# Patient Record
Sex: Male | Born: 1965 | Race: Black or African American | Hispanic: No | Marital: Single | Smoking: Former smoker
Health system: Southern US, Community
[De-identification: ages and names within clinical notes are randomized; demographics above are authoritative.]

## PROBLEM LIST (undated history)

## (undated) DIAGNOSIS — M87051 Idiopathic aseptic necrosis of right femur: Secondary | ICD-10-CM

## (undated) DIAGNOSIS — B20 Human immunodeficiency virus [HIV] disease: Secondary | ICD-10-CM

## (undated) DIAGNOSIS — Z21 Asymptomatic human immunodeficiency virus [HIV] infection status: Secondary | ICD-10-CM

## (undated) DIAGNOSIS — M87052 Idiopathic aseptic necrosis of left femur: Secondary | ICD-10-CM

## (undated) HISTORY — PX: FACIAL LACERATION REPAIR: SHX6589

## (undated) HISTORY — PX: JOINT REPLACEMENT: SHX530

---

## 1995-09-17 ENCOUNTER — Encounter (INDEPENDENT_AMBULATORY_CARE_PROVIDER_SITE_OTHER): Payer: Self-pay | Admitting: *Deleted

## 1995-09-17 LAB — CONVERTED CEMR LAB
CD4 Count: 390 microliters
CD4 T Cell Abs: 390

## 1999-03-19 DIAGNOSIS — B1710 Acute hepatitis C without hepatic coma: Secondary | ICD-10-CM | POA: Insufficient documentation

## 1999-03-19 DIAGNOSIS — B20 Human immunodeficiency virus [HIV] disease: Secondary | ICD-10-CM | POA: Insufficient documentation

## 1999-04-06 ENCOUNTER — Inpatient Hospital Stay (HOSPITAL_COMMUNITY): Admission: EM | Admit: 1999-04-06 | Discharge: 1999-04-12 | Payer: Self-pay | Admitting: Internal Medicine

## 1999-04-06 ENCOUNTER — Encounter: Payer: Self-pay | Admitting: Internal Medicine

## 1999-04-08 ENCOUNTER — Encounter: Payer: Self-pay | Admitting: Infectious Diseases

## 1999-04-09 ENCOUNTER — Encounter: Payer: Self-pay | Admitting: Infectious Diseases

## 1999-04-19 ENCOUNTER — Encounter: Admission: RE | Admit: 1999-04-19 | Discharge: 1999-04-19 | Payer: Self-pay | Admitting: Infectious Diseases

## 1999-04-19 ENCOUNTER — Ambulatory Visit (HOSPITAL_COMMUNITY): Admission: RE | Admit: 1999-04-19 | Discharge: 1999-04-19 | Payer: Self-pay | Admitting: Infectious Diseases

## 1999-04-27 ENCOUNTER — Encounter: Admission: RE | Admit: 1999-04-27 | Discharge: 1999-04-27 | Payer: Self-pay | Admitting: Infectious Diseases

## 1999-06-15 ENCOUNTER — Encounter: Admission: RE | Admit: 1999-06-15 | Discharge: 1999-06-15 | Payer: Self-pay | Admitting: Infectious Diseases

## 1999-06-23 ENCOUNTER — Ambulatory Visit (HOSPITAL_COMMUNITY): Admission: RE | Admit: 1999-06-23 | Discharge: 1999-06-23 | Payer: Self-pay | Admitting: Infectious Diseases

## 1999-06-27 ENCOUNTER — Encounter: Admission: RE | Admit: 1999-06-27 | Discharge: 1999-06-27 | Payer: Self-pay | Admitting: Infectious Diseases

## 1999-08-01 ENCOUNTER — Ambulatory Visit (HOSPITAL_COMMUNITY): Admission: RE | Admit: 1999-08-01 | Discharge: 1999-08-01 | Payer: Self-pay | Admitting: Infectious Diseases

## 1999-08-01 ENCOUNTER — Encounter: Admission: RE | Admit: 1999-08-01 | Discharge: 1999-08-01 | Payer: Self-pay | Admitting: Infectious Diseases

## 1999-09-07 ENCOUNTER — Encounter: Admission: RE | Admit: 1999-09-07 | Discharge: 1999-09-07 | Payer: Self-pay | Admitting: Infectious Diseases

## 2000-06-27 ENCOUNTER — Encounter: Admission: RE | Admit: 2000-06-27 | Discharge: 2000-06-27 | Payer: Self-pay | Admitting: Hematology and Oncology

## 2000-06-27 ENCOUNTER — Ambulatory Visit (HOSPITAL_COMMUNITY): Admission: RE | Admit: 2000-06-27 | Discharge: 2000-06-27 | Payer: Self-pay | Admitting: Hematology and Oncology

## 2000-07-20 ENCOUNTER — Encounter: Payer: Self-pay | Admitting: Infectious Diseases

## 2000-07-20 ENCOUNTER — Ambulatory Visit (HOSPITAL_COMMUNITY): Admission: RE | Admit: 2000-07-20 | Discharge: 2000-07-20 | Payer: Self-pay | Admitting: Infectious Diseases

## 2000-07-20 ENCOUNTER — Encounter: Admission: RE | Admit: 2000-07-20 | Discharge: 2000-07-20 | Payer: Self-pay | Admitting: Infectious Diseases

## 2000-07-20 ENCOUNTER — Inpatient Hospital Stay (HOSPITAL_COMMUNITY): Admission: EM | Admit: 2000-07-20 | Discharge: 2000-07-23 | Payer: Self-pay | Admitting: Internal Medicine

## 2000-07-23 ENCOUNTER — Encounter: Payer: Self-pay | Admitting: Internal Medicine

## 2000-07-27 ENCOUNTER — Encounter: Admission: RE | Admit: 2000-07-27 | Discharge: 2000-07-27 | Payer: Self-pay | Admitting: Internal Medicine

## 2000-08-08 ENCOUNTER — Encounter: Admission: RE | Admit: 2000-08-08 | Discharge: 2000-08-08 | Payer: Self-pay | Admitting: Infectious Diseases

## 2000-10-08 ENCOUNTER — Ambulatory Visit (HOSPITAL_COMMUNITY): Admission: RE | Admit: 2000-10-08 | Discharge: 2000-10-08 | Payer: Self-pay | Admitting: Infectious Diseases

## 2000-10-08 ENCOUNTER — Encounter: Admission: RE | Admit: 2000-10-08 | Discharge: 2000-10-08 | Payer: Self-pay | Admitting: Infectious Diseases

## 2000-12-03 ENCOUNTER — Encounter: Admission: RE | Admit: 2000-12-03 | Discharge: 2000-12-03 | Payer: Self-pay | Admitting: Infectious Diseases

## 2000-12-18 HISTORY — PX: TOTAL HIP ARTHROPLASTY: SHX124

## 2000-12-31 ENCOUNTER — Encounter: Admission: RE | Admit: 2000-12-31 | Discharge: 2000-12-31 | Payer: Self-pay | Admitting: Infectious Diseases

## 2001-01-01 ENCOUNTER — Encounter: Admission: RE | Admit: 2001-01-01 | Discharge: 2001-01-01 | Payer: Self-pay | Admitting: Infectious Diseases

## 2001-01-29 ENCOUNTER — Ambulatory Visit (HOSPITAL_COMMUNITY): Admission: RE | Admit: 2001-01-29 | Discharge: 2001-01-29 | Payer: Self-pay | Admitting: Infectious Diseases

## 2001-01-29 ENCOUNTER — Encounter: Admission: RE | Admit: 2001-01-29 | Discharge: 2001-01-29 | Payer: Self-pay | Admitting: Infectious Diseases

## 2001-02-25 ENCOUNTER — Encounter: Admission: RE | Admit: 2001-02-25 | Discharge: 2001-02-25 | Payer: Self-pay | Admitting: Infectious Diseases

## 2001-05-15 ENCOUNTER — Encounter: Admission: RE | Admit: 2001-05-15 | Discharge: 2001-05-15 | Payer: Self-pay | Admitting: Infectious Diseases

## 2001-05-15 ENCOUNTER — Ambulatory Visit (HOSPITAL_COMMUNITY): Admission: RE | Admit: 2001-05-15 | Discharge: 2001-05-15 | Payer: Self-pay | Admitting: Infectious Diseases

## 2001-06-03 ENCOUNTER — Encounter: Admission: RE | Admit: 2001-06-03 | Discharge: 2001-06-03 | Payer: Self-pay | Admitting: Infectious Diseases

## 2001-09-02 ENCOUNTER — Encounter: Admission: RE | Admit: 2001-09-02 | Discharge: 2001-09-02 | Payer: Self-pay | Admitting: Infectious Diseases

## 2001-09-02 ENCOUNTER — Ambulatory Visit (HOSPITAL_COMMUNITY): Admission: RE | Admit: 2001-09-02 | Discharge: 2001-09-02 | Payer: Self-pay | Admitting: Infectious Diseases

## 2001-09-23 ENCOUNTER — Encounter: Admission: RE | Admit: 2001-09-23 | Discharge: 2001-09-23 | Payer: Self-pay | Admitting: Infectious Diseases

## 2002-01-15 ENCOUNTER — Ambulatory Visit (HOSPITAL_COMMUNITY): Admission: RE | Admit: 2002-01-15 | Discharge: 2002-01-15 | Payer: Self-pay | Admitting: Infectious Diseases

## 2002-01-15 ENCOUNTER — Encounter: Admission: RE | Admit: 2002-01-15 | Discharge: 2002-01-15 | Payer: Self-pay | Admitting: Infectious Diseases

## 2002-02-18 ENCOUNTER — Encounter: Admission: RE | Admit: 2002-02-18 | Discharge: 2002-02-18 | Payer: Self-pay | Admitting: Infectious Diseases

## 2002-03-10 ENCOUNTER — Encounter: Admission: RE | Admit: 2002-03-10 | Discharge: 2002-03-10 | Payer: Self-pay | Admitting: Infectious Diseases

## 2002-06-09 ENCOUNTER — Ambulatory Visit (HOSPITAL_COMMUNITY): Admission: RE | Admit: 2002-06-09 | Discharge: 2002-06-09 | Payer: Self-pay | Admitting: Infectious Diseases

## 2002-06-09 ENCOUNTER — Encounter: Admission: RE | Admit: 2002-06-09 | Discharge: 2002-06-09 | Payer: Self-pay | Admitting: Internal Medicine

## 2002-08-04 ENCOUNTER — Encounter: Admission: RE | Admit: 2002-08-04 | Discharge: 2002-08-04 | Payer: Self-pay | Admitting: Infectious Diseases

## 2002-11-03 ENCOUNTER — Ambulatory Visit (HOSPITAL_COMMUNITY): Admission: RE | Admit: 2002-11-03 | Discharge: 2002-11-03 | Payer: Self-pay | Admitting: Infectious Diseases

## 2002-11-03 ENCOUNTER — Encounter: Admission: RE | Admit: 2002-11-03 | Discharge: 2002-11-03 | Payer: Self-pay | Admitting: Internal Medicine

## 2003-02-09 ENCOUNTER — Encounter: Admission: RE | Admit: 2003-02-09 | Discharge: 2003-02-09 | Payer: Self-pay | Admitting: Infectious Diseases

## 2003-02-12 ENCOUNTER — Encounter: Admission: RE | Admit: 2003-02-12 | Discharge: 2003-02-12 | Payer: Self-pay | Admitting: Infectious Diseases

## 2003-02-23 ENCOUNTER — Encounter: Admission: RE | Admit: 2003-02-23 | Discharge: 2003-02-23 | Payer: Self-pay | Admitting: Infectious Diseases

## 2003-04-18 DIAGNOSIS — M8700 Idiopathic aseptic necrosis of unspecified bone: Secondary | ICD-10-CM | POA: Insufficient documentation

## 2003-05-04 ENCOUNTER — Encounter: Payer: Self-pay | Admitting: Infectious Diseases

## 2003-05-04 ENCOUNTER — Ambulatory Visit (HOSPITAL_COMMUNITY): Admission: RE | Admit: 2003-05-04 | Discharge: 2003-05-04 | Payer: Self-pay | Admitting: Infectious Diseases

## 2003-05-18 ENCOUNTER — Encounter (INDEPENDENT_AMBULATORY_CARE_PROVIDER_SITE_OTHER): Payer: Self-pay | Admitting: Infectious Diseases

## 2003-05-18 ENCOUNTER — Encounter: Admission: RE | Admit: 2003-05-18 | Discharge: 2003-05-18 | Payer: Self-pay | Admitting: Infectious Diseases

## 2003-06-08 ENCOUNTER — Ambulatory Visit (HOSPITAL_COMMUNITY): Admission: RE | Admit: 2003-06-08 | Discharge: 2003-06-08 | Payer: Self-pay | Admitting: Infectious Diseases

## 2003-06-08 ENCOUNTER — Encounter: Admission: RE | Admit: 2003-06-08 | Discharge: 2003-06-08 | Payer: Self-pay | Admitting: Infectious Diseases

## 2003-06-08 ENCOUNTER — Encounter: Payer: Self-pay | Admitting: Infectious Diseases

## 2003-09-18 DIAGNOSIS — N289 Disorder of kidney and ureter, unspecified: Secondary | ICD-10-CM | POA: Insufficient documentation

## 2003-09-28 ENCOUNTER — Encounter: Admission: RE | Admit: 2003-09-28 | Discharge: 2003-09-28 | Payer: Self-pay | Admitting: Infectious Diseases

## 2003-09-28 ENCOUNTER — Ambulatory Visit (HOSPITAL_COMMUNITY): Admission: RE | Admit: 2003-09-28 | Discharge: 2003-09-28 | Payer: Self-pay | Admitting: Infectious Diseases

## 2003-09-28 ENCOUNTER — Encounter (INDEPENDENT_AMBULATORY_CARE_PROVIDER_SITE_OTHER): Payer: Self-pay | Admitting: Infectious Diseases

## 2003-10-12 ENCOUNTER — Encounter: Admission: RE | Admit: 2003-10-12 | Discharge: 2003-10-12 | Payer: Self-pay | Admitting: Infectious Diseases

## 2003-10-14 ENCOUNTER — Encounter: Admission: RE | Admit: 2003-10-14 | Discharge: 2003-10-14 | Payer: Self-pay | Admitting: Infectious Diseases

## 2003-10-16 ENCOUNTER — Ambulatory Visit (HOSPITAL_COMMUNITY): Admission: RE | Admit: 2003-10-16 | Discharge: 2003-10-16 | Payer: Self-pay | Admitting: Infectious Diseases

## 2003-12-09 ENCOUNTER — Encounter: Admission: RE | Admit: 2003-12-09 | Discharge: 2003-12-09 | Payer: Self-pay | Admitting: Infectious Diseases

## 2003-12-28 ENCOUNTER — Encounter: Admission: RE | Admit: 2003-12-28 | Discharge: 2003-12-28 | Payer: Self-pay | Admitting: Infectious Diseases

## 2004-02-08 ENCOUNTER — Encounter: Admission: RE | Admit: 2004-02-08 | Discharge: 2004-02-08 | Payer: Self-pay | Admitting: Infectious Diseases

## 2004-05-02 ENCOUNTER — Encounter: Admission: RE | Admit: 2004-05-02 | Discharge: 2004-05-02 | Payer: Self-pay | Admitting: Infectious Diseases

## 2004-05-23 ENCOUNTER — Encounter: Admission: RE | Admit: 2004-05-23 | Discharge: 2004-05-23 | Payer: Self-pay | Admitting: Infectious Diseases

## 2004-06-07 ENCOUNTER — Inpatient Hospital Stay (HOSPITAL_COMMUNITY): Admission: RE | Admit: 2004-06-07 | Discharge: 2004-06-11 | Payer: Self-pay | Admitting: Orthopedic Surgery

## 2004-08-24 ENCOUNTER — Ambulatory Visit (HOSPITAL_COMMUNITY): Admission: RE | Admit: 2004-08-24 | Discharge: 2004-08-24 | Payer: Self-pay | Admitting: Infectious Diseases

## 2004-08-24 ENCOUNTER — Ambulatory Visit: Payer: Self-pay | Admitting: Infectious Diseases

## 2004-09-21 ENCOUNTER — Ambulatory Visit: Payer: Self-pay | Admitting: Infectious Diseases

## 2005-01-02 ENCOUNTER — Ambulatory Visit (HOSPITAL_COMMUNITY): Admission: RE | Admit: 2005-01-02 | Discharge: 2005-01-02 | Payer: Self-pay | Admitting: Infectious Diseases

## 2005-01-02 ENCOUNTER — Ambulatory Visit: Payer: Self-pay | Admitting: Infectious Diseases

## 2005-01-16 ENCOUNTER — Ambulatory Visit: Payer: Self-pay | Admitting: Infectious Diseases

## 2005-01-26 ENCOUNTER — Emergency Department: Payer: Self-pay | Admitting: Internal Medicine

## 2005-01-26 ENCOUNTER — Encounter (INDEPENDENT_AMBULATORY_CARE_PROVIDER_SITE_OTHER): Payer: Self-pay | Admitting: Specialist

## 2005-01-26 ENCOUNTER — Inpatient Hospital Stay (HOSPITAL_COMMUNITY): Admission: EM | Admit: 2005-01-26 | Discharge: 2005-01-28 | Payer: Self-pay | Admitting: Internal Medicine

## 2005-01-26 ENCOUNTER — Ambulatory Visit: Payer: Self-pay | Admitting: Internal Medicine

## 2005-01-30 ENCOUNTER — Ambulatory Visit: Payer: Self-pay | Admitting: Infectious Diseases

## 2005-02-08 ENCOUNTER — Ambulatory Visit: Payer: Self-pay | Admitting: Infectious Diseases

## 2005-03-15 ENCOUNTER — Ambulatory Visit: Payer: Self-pay | Admitting: Infectious Diseases

## 2005-04-26 ENCOUNTER — Ambulatory Visit (HOSPITAL_COMMUNITY): Admission: RE | Admit: 2005-04-26 | Discharge: 2005-04-26 | Payer: Self-pay | Admitting: Infectious Diseases

## 2005-04-26 ENCOUNTER — Ambulatory Visit: Payer: Self-pay | Admitting: Infectious Diseases

## 2005-05-10 ENCOUNTER — Ambulatory Visit: Payer: Self-pay | Admitting: Infectious Diseases

## 2005-06-14 ENCOUNTER — Ambulatory Visit: Payer: Self-pay | Admitting: Infectious Diseases

## 2005-06-16 ENCOUNTER — Ambulatory Visit: Payer: Self-pay | Admitting: Infectious Diseases

## 2005-09-11 ENCOUNTER — Ambulatory Visit (HOSPITAL_COMMUNITY): Admission: RE | Admit: 2005-09-11 | Discharge: 2005-09-11 | Payer: Self-pay | Admitting: Infectious Diseases

## 2005-09-11 ENCOUNTER — Ambulatory Visit: Payer: Self-pay | Admitting: Infectious Diseases

## 2005-09-11 ENCOUNTER — Encounter (INDEPENDENT_AMBULATORY_CARE_PROVIDER_SITE_OTHER): Payer: Self-pay | Admitting: *Deleted

## 2005-09-11 LAB — CONVERTED CEMR LAB
CD4 Count: 430 microliters
HIV 1 RNA Quant: 49 copies/mL

## 2005-09-26 ENCOUNTER — Ambulatory Visit: Payer: Self-pay | Admitting: Infectious Diseases

## 2006-01-25 ENCOUNTER — Ambulatory Visit: Payer: Self-pay | Admitting: Internal Medicine

## 2006-01-25 ENCOUNTER — Encounter (INDEPENDENT_AMBULATORY_CARE_PROVIDER_SITE_OTHER): Payer: Self-pay | Admitting: *Deleted

## 2006-01-25 LAB — CONVERTED CEMR LAB: HIV 1 RNA Quant: 49 copies/mL

## 2006-02-14 ENCOUNTER — Ambulatory Visit: Payer: Self-pay | Admitting: Infectious Diseases

## 2006-08-16 ENCOUNTER — Encounter (INDEPENDENT_AMBULATORY_CARE_PROVIDER_SITE_OTHER): Payer: Self-pay | Admitting: *Deleted

## 2006-08-16 ENCOUNTER — Encounter: Admission: RE | Admit: 2006-08-16 | Discharge: 2006-08-16 | Payer: Self-pay | Admitting: Infectious Diseases

## 2006-08-16 ENCOUNTER — Ambulatory Visit: Payer: Self-pay | Admitting: Infectious Diseases

## 2006-08-16 LAB — CONVERTED CEMR LAB
CD4 Count: 530 microliters
HIV 1 RNA Quant: 160 copies/mL

## 2006-09-03 ENCOUNTER — Ambulatory Visit: Payer: Self-pay | Admitting: Infectious Diseases

## 2006-10-23 DIAGNOSIS — B370 Candidal stomatitis: Secondary | ICD-10-CM | POA: Insufficient documentation

## 2006-10-23 DIAGNOSIS — B59 Pneumocystosis: Secondary | ICD-10-CM | POA: Insufficient documentation

## 2006-10-23 DIAGNOSIS — T8489XA Other specified complication of internal orthopedic prosthetic devices, implants and grafts, initial encounter: Secondary | ICD-10-CM | POA: Insufficient documentation

## 2007-02-05 ENCOUNTER — Encounter (INDEPENDENT_AMBULATORY_CARE_PROVIDER_SITE_OTHER): Payer: Self-pay | Admitting: *Deleted

## 2007-02-05 ENCOUNTER — Ambulatory Visit: Payer: Self-pay | Admitting: Infectious Diseases

## 2007-02-05 ENCOUNTER — Encounter: Admission: RE | Admit: 2007-02-05 | Discharge: 2007-02-05 | Payer: Self-pay | Admitting: Infectious Diseases

## 2007-02-05 LAB — CONVERTED CEMR LAB
ALT: 26 units/L (ref 0–53)
AST: 24 units/L (ref 0–37)
Albumin: 4.6 g/dL (ref 3.5–5.2)
Alkaline Phosphatase: 78 units/L (ref 39–117)
BUN: 19 mg/dL (ref 6–23)
Basophils Absolute: 0 10*3/uL (ref 0.0–0.1)
Basophils Relative: 1 % (ref 0–1)
Bilirubin Urine: NEGATIVE
CD4 Count: 500 microliters
CO2: 26 meq/L (ref 19–32)
Calcium: 9.6 mg/dL (ref 8.4–10.5)
Chloride: 106 meq/L (ref 96–112)
Cholesterol: 238 mg/dL — ABNORMAL HIGH (ref 0–200)
Creatinine, Ser: 1.59 mg/dL — ABNORMAL HIGH (ref 0.40–1.50)
Eosinophils Absolute: 0.1 10*3/uL (ref 0.0–0.7)
Eosinophils Relative: 1 % (ref 0–5)
Glucose, Bld: 86 mg/dL (ref 70–99)
HCT: 46.4 % (ref 39.0–52.0)
HDL: 69 mg/dL (ref >39)
HIV 1 RNA Quant: 159 copies/mL — ABNORMAL HIGH (ref <50)
HIV-1 RNA Quant, Log: 2.2 — ABNORMAL HIGH (ref <1.70)
Hemoglobin, Urine: NEGATIVE
Hemoglobin: 15.2 g/dL (ref 13.0–17.0)
Ketones, ur: NEGATIVE mg/dL
LDL Cholesterol: 136 mg/dL — ABNORMAL HIGH (ref 0–99)
Leukocytes, UA: NEGATIVE
Lymphocytes Relative: 40 % (ref 12–46)
Lymphs Abs: 1.9 10*3/uL (ref 0.7–3.3)
MCHC: 32.8 g/dL (ref 30.0–36.0)
MCV: 97.5 fL (ref 78.0–100.0)
Monocytes Absolute: 0.5 10*3/uL (ref 0.2–0.7)
Monocytes Relative: 11 % (ref 3–11)
Neutro Abs: 2.3 10*3/uL (ref 1.7–7.7)
Neutrophils Relative %: 48 % (ref 43–77)
Nitrite: NEGATIVE
Platelets: 149 10*3/uL — ABNORMAL LOW (ref 150–400)
Potassium: 4.3 meq/L (ref 3.5–5.3)
Protein, ur: NEGATIVE mg/dL
RBC: 4.76 M/uL (ref 4.22–5.81)
RDW: 13 % (ref 11.5–14.0)
Sodium: 141 meq/L (ref 135–145)
Specific Gravity, Urine: 1.021 (ref 1.005–1.03)
Total Bilirubin: 1.1 mg/dL (ref 0.3–1.2)
Total CHOL/HDL Ratio: 3.4
Total Protein: 7.6 g/dL (ref 6.0–8.3)
Triglycerides: 164 mg/dL — ABNORMAL HIGH (ref <150)
Urine Glucose: NEGATIVE mg/dL
Urobilinogen, UA: 1 (ref 0.0–1.0)
VLDL: 33 mg/dL (ref 0–40)
WBC: 4.8 10*3/uL (ref 4.0–10.5)
pH: 7 (ref 5.0–8.0)

## 2007-02-11 ENCOUNTER — Encounter (INDEPENDENT_AMBULATORY_CARE_PROVIDER_SITE_OTHER): Payer: Self-pay | Admitting: *Deleted

## 2007-02-11 LAB — CONVERTED CEMR LAB: HCV Quantitative: 4680000 intl units/mL

## 2007-02-21 ENCOUNTER — Ambulatory Visit: Payer: Self-pay | Admitting: Infectious Diseases

## 2007-02-24 ENCOUNTER — Encounter (INDEPENDENT_AMBULATORY_CARE_PROVIDER_SITE_OTHER): Payer: Self-pay | Admitting: *Deleted

## 2007-02-26 ENCOUNTER — Encounter (INDEPENDENT_AMBULATORY_CARE_PROVIDER_SITE_OTHER): Payer: Self-pay | Admitting: *Deleted

## 2007-11-11 ENCOUNTER — Encounter: Admission: RE | Admit: 2007-11-11 | Discharge: 2007-11-11 | Payer: Self-pay | Admitting: *Deleted

## 2007-11-11 ENCOUNTER — Encounter: Payer: Self-pay | Admitting: Infectious Diseases

## 2007-11-11 ENCOUNTER — Ambulatory Visit: Payer: Self-pay | Admitting: Infectious Diseases

## 2007-11-11 LAB — CONVERTED CEMR LAB
ALT: 19 units/L (ref 0–53)
AST: 24 units/L (ref 0–37)
Albumin: 4.4 g/dL (ref 3.5–5.2)
Alkaline Phosphatase: 68 units/L (ref 39–117)
BUN: 18 mg/dL (ref 6–23)
Basophils Absolute: 0 10*3/uL (ref 0.0–0.1)
Basophils Relative: 1 % (ref 0–1)
CO2: 21 meq/L (ref 19–32)
Calcium: 9.5 mg/dL (ref 8.4–10.5)
Chloride: 106 meq/L (ref 96–112)
Creatinine, Ser: 1.59 mg/dL — ABNORMAL HIGH (ref 0.40–1.50)
Eosinophils Absolute: 0.1 10*3/uL — ABNORMAL LOW (ref 0.2–0.7)
Eosinophils Relative: 1 % (ref 0–5)
Glucose, Bld: 78 mg/dL (ref 70–99)
HCT: 46.1 % (ref 39.0–52.0)
HIV 1 RNA Quant: 179 copies/mL — ABNORMAL HIGH (ref <50)
HIV-1 RNA Quant, Log: 2.25 — ABNORMAL HIGH (ref <1.70)
Hemoglobin: 15.6 g/dL (ref 13.0–17.0)
Lymphocytes Relative: 29 % (ref 12–46)
Lymphs Abs: 1.5 10*3/uL (ref 0.7–4.0)
MCHC: 33.8 g/dL (ref 30.0–36.0)
MCV: 93.7 fL (ref 78.0–100.0)
Monocytes Absolute: 0.5 10*3/uL (ref 0.1–1.0)
Monocytes Relative: 10 % (ref 3–12)
Neutro Abs: 3 10*3/uL (ref 1.7–7.7)
Neutrophils Relative %: 59 % (ref 43–77)
Platelets: 150 10*3/uL (ref 150–400)
Potassium: 3.6 meq/L (ref 3.5–5.3)
RBC: 4.92 M/uL (ref 4.22–5.81)
RDW: 12.9 % (ref 11.5–15.5)
Sodium: 140 meq/L (ref 135–145)
Total Bilirubin: 1 mg/dL (ref 0.3–1.2)
Total Protein: 7.2 g/dL (ref 6.0–8.3)
WBC: 5.2 10*3/uL (ref 4.0–10.5)

## 2007-12-04 ENCOUNTER — Ambulatory Visit: Payer: Self-pay | Admitting: Infectious Diseases

## 2007-12-17 ENCOUNTER — Encounter (INDEPENDENT_AMBULATORY_CARE_PROVIDER_SITE_OTHER): Payer: Self-pay | Admitting: *Deleted

## 2008-12-02 ENCOUNTER — Encounter (INDEPENDENT_AMBULATORY_CARE_PROVIDER_SITE_OTHER): Payer: Self-pay | Admitting: *Deleted

## 2010-09-02 ENCOUNTER — Encounter: Payer: Self-pay | Admitting: Infectious Disease

## 2010-09-06 ENCOUNTER — Ambulatory Visit: Payer: Self-pay | Admitting: Internal Medicine

## 2010-09-06 LAB — CONVERTED CEMR LAB
ALT: 71 units/L — ABNORMAL HIGH (ref 0–53)
AST: 66 units/L — ABNORMAL HIGH (ref 0–37)
Albumin: 4.3 g/dL (ref 3.5–5.2)
Alkaline Phosphatase: 68 units/L (ref 39–117)
BUN: 17 mg/dL (ref 6–23)
Basophils Absolute: 0 10*3/uL (ref 0.0–0.1)
Basophils Relative: 0 % (ref 0–1)
CO2: 28 meq/L (ref 19–32)
Calcium: 9.5 mg/dL (ref 8.4–10.5)
Chloride: 102 meq/L (ref 96–112)
Cholesterol: 153 mg/dL (ref 0–200)
Creatinine, Ser: 1.42 mg/dL (ref 0.40–1.50)
Eosinophils Absolute: 0 10*3/uL (ref 0.0–0.7)
Eosinophils Relative: 1 % (ref 0–5)
Glucose, Bld: 83 mg/dL (ref 70–99)
HCT: 45.2 % (ref 39.0–52.0)
HDL: 39 mg/dL — ABNORMAL LOW (ref >39)
HIV 1 RNA Quant: 106000 copies/mL — ABNORMAL HIGH (ref <20)
HIV-1 RNA Quant, Log: 5.03 — ABNORMAL HIGH (ref <1.30)
Hemoglobin: 15.5 g/dL (ref 13.0–17.0)
LDL Cholesterol: 90 mg/dL (ref 0–99)
Lymphocytes Relative: 26 % (ref 12–46)
Lymphs Abs: 0.8 10*3/uL (ref 0.7–4.0)
MCHC: 34.3 g/dL (ref 30.0–36.0)
MCV: 89 fL (ref 78.0–100.0)
Monocytes Absolute: 0.4 10*3/uL (ref 0.1–1.0)
Monocytes Relative: 15 % — ABNORMAL HIGH (ref 3–12)
Neutro Abs: 1.7 10*3/uL (ref 1.7–7.7)
Neutrophils Relative %: 57 % (ref 43–77)
Platelets: 148 10*3/uL — ABNORMAL LOW (ref 150–400)
Potassium: 3.9 meq/L (ref 3.5–5.3)
RBC: 5.08 M/uL (ref 4.22–5.81)
RDW: 13.4 % (ref 11.5–15.5)
Sodium: 139 meq/L (ref 135–145)
Total Bilirubin: 0.7 mg/dL (ref 0.3–1.2)
Total CHOL/HDL Ratio: 3.9
Total Protein: 7.6 g/dL (ref 6.0–8.3)
Triglycerides: 118 mg/dL (ref <150)
VLDL: 24 mg/dL (ref 0–40)
WBC: 2.9 10*3/uL — ABNORMAL LOW (ref 4.0–10.5)

## 2010-09-20 ENCOUNTER — Ambulatory Visit: Payer: Self-pay | Admitting: Internal Medicine

## 2010-10-31 ENCOUNTER — Telehealth: Payer: Self-pay

## 2010-11-03 ENCOUNTER — Telehealth: Payer: Self-pay | Admitting: *Deleted

## 2010-11-14 ENCOUNTER — Encounter (INDEPENDENT_AMBULATORY_CARE_PROVIDER_SITE_OTHER): Payer: Self-pay | Admitting: *Deleted

## 2010-12-27 ENCOUNTER — Ambulatory Visit
Admission: RE | Admit: 2010-12-27 | Discharge: 2010-12-27 | Payer: Self-pay | Source: Home / Self Care | Attending: Internal Medicine | Admitting: Internal Medicine

## 2010-12-27 ENCOUNTER — Encounter: Payer: Self-pay | Admitting: Internal Medicine

## 2010-12-27 LAB — CONVERTED CEMR LAB
ALT: 22 units/L (ref 0–53)
AST: 26 units/L (ref 0–37)
Albumin: 4.4 g/dL (ref 3.5–5.2)
Alkaline Phosphatase: 72 units/L (ref 39–117)
BUN: 16 mg/dL (ref 6–23)
Basophils Absolute: 0 10*3/uL (ref 0.0–0.1)
Basophils Relative: 0 % (ref 0–1)
CO2: 23 meq/L (ref 19–32)
Calcium: 9.4 mg/dL (ref 8.4–10.5)
Chloride: 104 meq/L (ref 96–112)
Creatinine, Ser: 1.51 mg/dL — ABNORMAL HIGH (ref 0.40–1.50)
Eosinophils Absolute: 0.1 10*3/uL (ref 0.0–0.7)
Eosinophils Relative: 2 % (ref 0–5)
Glucose, Bld: 126 mg/dL — ABNORMAL HIGH (ref 70–99)
HCT: 44.8 % (ref 39.0–52.0)
HIV 1 RNA Quant: 120 copies/mL — ABNORMAL HIGH (ref <20)
HIV-1 RNA Quant, Log: 2.08 — ABNORMAL HIGH (ref <1.30)
Hemoglobin: 15.4 g/dL (ref 13.0–17.0)
Lymphocytes Relative: 34 % (ref 12–46)
Lymphs Abs: 1.6 10*3/uL (ref 0.7–4.0)
MCHC: 34.4 g/dL (ref 30.0–36.0)
MCV: 92.8 fL (ref 78.0–100.0)
Monocytes Absolute: 0.5 10*3/uL (ref 0.1–1.0)
Monocytes Relative: 10 % (ref 3–12)
Neutro Abs: 2.4 10*3/uL (ref 1.7–7.7)
Neutrophils Relative %: 53 % (ref 43–77)
Platelets: 153 10*3/uL (ref 150–400)
Potassium: 3.8 meq/L (ref 3.5–5.3)
RBC: 4.83 M/uL (ref 4.22–5.81)
RDW: 14.5 % (ref 11.5–15.5)
Sodium: 138 meq/L (ref 135–145)
Total Bilirubin: 1 mg/dL (ref 0.3–1.2)
Total Protein: 7.8 g/dL (ref 6.0–8.3)
WBC: 4.6 10*3/uL (ref 4.0–10.5)

## 2011-01-02 LAB — T-HELPER CELL (CD4) - (RCID CLINIC ONLY)
CD4 % Helper T Cell: 26 % — ABNORMAL LOW (ref 33–55)
CD4 T Cell Abs: 410 uL (ref 400–2700)

## 2011-01-07 ENCOUNTER — Encounter: Payer: Self-pay | Admitting: Infectious Diseases

## 2011-01-12 ENCOUNTER — Ambulatory Visit: Admit: 2011-01-12 | Payer: Self-pay | Admitting: Internal Medicine

## 2011-01-19 ENCOUNTER — Ambulatory Visit (INDEPENDENT_AMBULATORY_CARE_PROVIDER_SITE_OTHER): Payer: Self-pay | Admitting: Adult Health

## 2011-01-19 DIAGNOSIS — B20 Human immunodeficiency virus [HIV] disease: Secondary | ICD-10-CM

## 2011-01-19 NOTE — Miscellaneous (Signed)
Summary: Orders Update - labs  Clinical Lists Changes  Problems: Added new problem of ENCOUNTER FOR LONG-TERM USE OF OTHER MEDICATIONS (ICD-V58.69) Orders: Added new Test order of T-CBC w/Diff [phone]) - Signed Added new Test order of T-CD4SP [facility]) (CD4SP) - Signed Added new Test order of T-Comprehensive Metabolic Panel [phone]) - Signed Added new Test order of T-HIV Viral Load [phone]) - Signed Added new Test order of T-RPR (Syphilis) [phone]) - Signed Added new Test order of T-HIV Genotype ([id number]) - Signed Added new Test order of T-Lipid Profile ([id number]) - Signed     Process Orders Check Orders Results:     [facility]: ABN not required for this insurance Order queued for requisitioning for [facility]: September 02, 2010 3:03 PM  Tests Sent for requisitioning (September 02, 2010 3:03 PM):     09/06/2010: [facility] -- T-CBC w/Diff [[id number]] (signed)     09/06/2010: [facility] -- T-Comprehensive Metabolic Panel [[id number]] (signed)     09/06/2010: [facility] -- T-HIV Viral Load [phone] (signed)     09/06/2010: [facility] -- T-RPR (Syphilis) [phone] (signed)     09/06/2010: [facility] -- T-HIV Genotype [phone] (signed)     09/06/2010: [facility] -- T-Lipid Profile [phone] (signed)

## 2011-01-19 NOTE — Miscellaneous (Signed)
  Clinical Lists Changes  Observations: Added new observation of YEARAIDSPOS: 2003  (11/14/2010 14:53)

## 2011-01-19 NOTE — Progress Notes (Signed)
  Phone Note Outgoing Call   Call placed by: Jimmy Footman, CMA,  November 03, 2010 12:01 PM Call placed to: Patient Summary of Call: LVM informing him that samples are ready to be picked up @ office before 5pm

## 2011-01-19 NOTE — Progress Notes (Signed)
Summary: Requesting help with medications  Phone Note Call from Patient   Summary of Call: pt will need to cancel his appt since his insurance does not start unitl Jan. 2012  He is also requesting samples, only 10 days of meds left.  Initial call taken by: Tomasita Morrow RN,  October 31, 2010 10:55 AM  Follow-up for Phone Call        Samples of Kaletra and Sustiva given. Message left with family member, samples ready for pick up at office. Tomasita Morrow RN  November 02, 2010 3:16 PM   Follow-up by: Tomasita Morrow RN,  November 02, 2010 3:16 PM    Prescriptions: SUSTIVA 600 MG TABS (EFAVIRENZ) Take 1 tablet by mouth at bedtime  #30 x 0   Entered by:   Tomasita Morrow RN   Authorized by:   Yisroel Ramming MD   Signed by:   Tomasita Morrow RN on 11/02/2010   Method used:   Samples Given   RxID:   [id number] KALETRA 200-50 MG TABS (LOPINAVIR-RITONAVIR) Take 2 tablets by mouth twice daily  #120 x 0   Entered by:   Tomasita Morrow RN   Authorized by:   Yisroel Ramming MD   Signed by:   Tomasita Morrow RN on 11/02/2010   Method used:   Samples Given   RxID:   [id number]  Kaletra Lot # [id number]  exp 08-08-12   Sustiva Lot# [id number]  exp 04-2013 Tomasita Morrow RN  November 02, 2010 3:15 PM   Appended Document: Requesting help with medications pt. picked up samples

## 2011-01-19 NOTE — Assessment & Plan Note (Signed)
Summary: OV transfer    CC:  follow-up visit, lab results, and pt. has been off meds for three months.  History of Present Illness: Pt had moved to [facility] but was not in care there. He has been off meds for some time. He returned dut to his job and family issues.  When he was on his Kaletra he tolerated it well. 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 He would like to re-establish and get back on meds.  Preventive Screening-Counseling & Management  Alcohol-Tobacco     Alcohol drinks/day: <1     Alcohol type: beer     Smoking Status: never     Passive Smoke Exposure: no  Caffeine-Diet-Exercise     Caffeine use/day: 1     Does Patient Exercise: yes     Type of exercise: walking     Times/week: 5  Hep-HIV-STD-Contraception     HIV Risk: no  Safety-Violence-Falls     Seat Belt Use: 100      Sexual History:  none.        Drug Use:  never and no.    Comments: pt. 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  Saw renal at one point. 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    Vital Signs:  Patient profile:   45 year old male Height:      67 inches (170.18 cm) Weight:      178.8 pounds (81.27 kg) BMI:     28.11 Temp:     98.5 degrees F (36.94 degrees C) oral Pulse rate:   96 / minute BP sitting:   128 / 86  (right arm)  Vitals Entered By: Wendall Mola CMA Duncan Dull) (September 20, 2010 3:16 PM) CC: follow-up visit, lab results, pt. has been off meds for three months Is Patient Diabetic? No Pain Assessment Patient in pain? yes     Location: right leg Intensity: 6 Type: sharp Onset of pain  Constant Nutritional Status BMI of 25 - 29 = overweight Nutritional Status Detail appetite "great"  Does patient need assistance? Functional Status Self care Ambulation Normal   Physical Exam  General:  alert, well-developed, well-nourished, and well-hydrated.   Head:  normocephalic and atraumatic.   Mouth:  pharynx pink and moist.   Lungs:  normal breath sounds.          Medication Adherence: 09/20/2010   Adherence to medications reviewed with patient. Counseling to provide adequate adherence provided   Prevention For Positives: 09/20/2010   Safe sex practices discussed with patient. Condoms offered.  Impression & Recommendations:  Problem # 1:  AIDS (ICD-042) VL up and CD4ct down.  In reviewingprevious genotypes it looks like he has a lot of NRTI resistance. He appeared to be doing well when he was on his Sustiva nad Kaletra so willtry to put him back on that and re-check his labs in 6 weeks.  Encouraged him to take his meds every day.  Influenza vaccine and pneumovax given. Diagnostics Reviewed:  CD4: 200 (09/08/2010)   WBC: 2.9 (09/06/2010)   Hgb: 15.5 (09/06/2010)   HCT: 45.2 (09/06/2010)    Platelets: 148 (09/06/2010) HIV genotype: See Comment (09/06/2010)   HIV-1 RNA: 106000 (09/06/2010)   HBSAg: No (02/11/2007)  Medications Added to Medication List This Visit: 1)  Kaletra 200-50 Mg Tabs (Lopinavir-ritonavir) .... Take 2 tablets by mouth twice daily  Other Orders: Est. Patient Level IV (95621) Influenza Vaccine NON MCR (30865) Pneumococcal Vaccine (78469) Admin 1st Vaccine (62952) Future Orders: T-CD4SP ([facility]) (CD4SP) ... 11/01/2010 T-HIV Viral Load [phone]) ... 11/01/2010 T-Comprehensive Metabolic Panel [phone]) ... 11/01/2010 T-CBC w/Diff ([id number]) ... 11/01/2010  Patient Instructions: 1)  Please schedule a follow-up appointment in 8 weeks, 2 weeks after labs.  Prescriptions: KALETRA 200-50 MG TABS (LOPINAVIR-RITONAVIR) Take 2 tablets by mouth twice daily  #360 x 3   Entered and Authorized by:   Yisroel Ramming MD   Signed by:   Yisroel Ramming MD on 09/20/2010   Method used:   Print then Give to Patient   RxID:   [id number] SUSTIVA 600 MG TABS (EFAVIRENZ) Take 1 tablet by mouth at bedtime  #90 x 3   Entered and Authorized by:   Yisroel Ramming MD   Signed by:   Yisroel Ramming MD on 09/20/2010   Method used:   Print then Give to Patient   RxID:   [id number] KALETRA 200-50 MG TABS (LOPINAVIR-RITONAVIR) Take 2 tablets by mouth twice daily  #360 x 3   Entered and Authorized by:   Yisroel Ramming MD   Signed by:   Yisroel Ramming MD on 09/20/2010   Method used:   Print then Give to Patient   RxID:   [id number]    Immunizations Administered:  Influenza Vaccine # 1:    Vaccine Type: Fluvax Non-MCR    Site: right deltoid    Mfr: Novartis    Dose: 0.5 ml    Route: IM    Given by: Wendall Mola CMA ( AAMA)    Exp. Date: 03/19/2011    Lot #: [id number]    VIS given: 07/12/10 version given September 20, 2010.  Pneumonia Vaccine:    Vaccine Type: Pneumovax    Site: left deltoid    Mfr: Merck    Dose: 0.5 ml    Route: IM    Given  by: Wendall Mola CMA ( AAMA)    Exp. Date: 03/03/2012    Lot #: [id number]    VIS given: 11/22/09 version given September 20, 2010.  Flu Vaccine Consent Questions:    Do you have a history of severe allergic reactions to this vaccine? no    Any prior history of allergic reactions to egg and/or gelatin? no    Do you have a sensitivity to the preservative Thimersol? no    Do you have a past history of Guillan-Barre Syndrome? no    Do you currently have an acute febrile illness? no    Have you ever had a severe reaction to latex? no    Vaccine information given  and explained to patient? yes

## 2011-01-19 NOTE — Miscellaneous (Signed)
Summary: HIPAA Restrictions  HIPAA Restrictions   Imported By: Florinda Marker 09/08/2010 16:06:39  _____________________________________________________________________  External Attachment:    Type:   Image     Comment:   External Document

## 2011-03-02 LAB — T-HELPER CELL (CD4) - (RCID CLINIC ONLY)
CD4 % Helper T Cell: 24 % — ABNORMAL LOW (ref 33–55)
CD4 T Cell Abs: 200 uL — ABNORMAL LOW (ref 400–2700)

## 2011-03-06 ENCOUNTER — Other Ambulatory Visit: Payer: Self-pay

## 2011-03-07 ENCOUNTER — Encounter: Payer: Self-pay | Admitting: Adult Health

## 2011-03-20 ENCOUNTER — Ambulatory Visit: Payer: Self-pay | Admitting: Adult Health

## 2011-04-18 NOTE — Progress Notes (Signed)
Vital Signs:  Patient profile: 45 year old male Height:    67 inches Weight:    178 pounds BMI:  27.98 Temp:  98.3 degrees F oral Pulse rate: 76 / minute BP sitting: 135 / 88  (left arm)  Vitals Entered By: Alesia Morin CMA (January 19, 2011 2:36 PM) CC: follow-up visit for lab Is Patient Diabetic? No Pain Assessment Patient in pain? no      Nutritional Status BMI of 25 - 29 = overweight Nutritional Status Detail appetite "good"  Have you ever been in a relationship where you felt threatened, hurt or afraid?No   Does patient need assistance?  Functional Status Self care Ambulation Normal Comments no missed doses of meds   CC:  follow-up visit for lab.  History of Present Illness:  presents for followup from labs that were drawn in January 2012. Claims adherent to his current regimen of Kaletra and Sustiva. Does complain of some GI intolerance while on the Kaletra. While he has been on this regimen for some time , he is inquiring whether there is another medication he could use in the place of Kaletra. Currently , he is not having severe pain in his hip or his joints and seems to be ambulating without to much difficulty.  Preventive Screening-Counseling & Management  Alcohol-Tobacco     Alcohol drinks/day: <1     Alcohol type: beer     Smoking Status: never     Passive Smoke Exposure: no  Caffeine-Diet-Exercise     Caffeine use/day: 1     Does Patient Exercise: yes     Type of exercise: walking     Times/week: 5  Hep-HIV-STD-Contraception     HIV Risk: no  Safety-Violence-Falls     Seat Belt Use: 100      Sexual History:  none.        Drug Use:  never and no.    Comments:  pt declined condoms  Allergies (verified):  No Known Drug Allergies  Past History:  Past medical, surgical, family and social histories (including risk factors) reviewed for relevance to current acute and chronic problems.  Past Medical History: Reviewed history from 12/04/2007  and no changes required. 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  Saw renal at one point. 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 Mother - Alive, DM, HTN Father - died young Siblings - a+w  Social History: Reviewed history from 12/04/2007 and no changes required. 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 Occas etoh Lives with mother. Not sexually active   Review of Systems  General:  Denies chills, fatigue, fever, loss of appetite, malaise, sleep disorder, sweats, weakness, and weight loss. Eyes:  Denies blurring, discharge, double vision, eye irritation, eye pain, halos, itching, light sensitivity, red eye, vision loss-1 eye, and vision loss-both eyes. ENT:  Denies decreased hearing, difficulty swallowing, ear discharge, earache, hoarseness, nasal congestion, nosebleeds, postnasal drainage, ringing in ears, sinus pressure, and sore throat. CV:  Denies bluish discoloration of lips or nails, chest pain or discomfort, difficulty breathing at night, difficulty breathing while lying down, fainting, fatigue, leg cramps with exertion, lightheadness, near fainting, palpitations, shortness of breath with  exertion, swelling of feet, swelling of hands, and weight gain. Resp:  Denies chest discomfort, chest pain with inspiration, cough, coughing up blood, excessive snoring, hypersomnolence, morning headaches, pleuritic, shortness of breath, sputum  productive, and wheezing. GI:  Complains of abdominal pain, change in bowel habits, and diarrhea; denies bloody stools, constipation, dark tarry stools, excessive appetite, gas, hemorrhoids, indigestion, loss of appetite, nausea, vomiting, vomiting blood, and yellowish skin color. GU:  Denies decreased libido, discharge, dysuria, erectile dysfunction, genital sores, hematuria, incontinence, nocturia, urinary frequency, and urinary hesitancy. MS:  Complains of stiffness; denies joint pain, joint redness, joint swelling, loss of strength, and muscle weakness. Derm:  Denies changes in color of skin, changes in nail beds, dryness, excessive perspiration, flushing, hair loss, insect bite(s), itching, lesion(s), poor wound healing, and rash. Neuro:  Denies brief paralysis, difficulty with concentration, disturbances in coordination, falling down, headaches, inability to speak, memory loss, numbness, poor balance, seizures, sensation of room spinning, tingling, tremors, visual disturbances, and weakness. Psych:  Denies alternate hallucination ( auditory/visual), anxiety, depression, easily angered, easily tearful, irritability, mental problems, panic attacks, sense of great danger, suicidal thoughts/plans, thoughts of violence, unusual visions or sounds, and thoughts /plans of harming others.  Physical Exam  General:  alert, well-developed, well-nourished, and well-hydrated.   Head:  normocephalic and atraumatic.   Eyes:  vision grossly intact, pupils equal, pupils round, and pupils reactive to light.   Ears:  R ear normal and L ear normal.   Mouth:  no gingival abnormalities, pharynx pink and moist, and fair dentition.   Neck:  supple, full ROM, and no masses.   Lungs:  normal breath sounds.  normal respiratory effort.   Heart:  normal rate and regular rhythm.   Abdomen:  soft, non-tender, and normal bowel sounds.   Msk:  no crepitation and decreased ROM.   Extremities:  No clubbing, cyanosis, edema,  or deformity noted.   Neurologic:  alert & oriented X3, cranial nerves II-XII intact, and strength normal in all extremities.   Skin:  Intact without suspicious lesions or rashes Psych:  Cognition and judgment appear intact. Alert and cooperative with normal attention span and concentration. No apparent delusions, illusions, hallucinations   Impression & Recommendations:  Problem # 1:  HIV INFECTION (ICD-042)  his CD4 in January 2012 was 410 at 26% with a viral load of 120 copies per mL. Given some of the past medical history reviewed in his record , it is understandable that he is currently on a 2 drug regimen. From review of past lab  values , it is apparent that he has had a sustained neurologic response to this particular regimen. However , given his new reports of GI intolerance we should entertain an alternative to his medications. Therefore , we will discontinue his Kaletra and start Isentress 400 mg by mouth every 12 hours. he is to return to clinic in 4 weeks for repeat labs with a followup in 6 weeks. He verbally acknowledged this information and agreed with plan of care. Orders: Est. Patient Level III (99213)Future Orders: T-CBC w/Diff ([id number]) ... 02/16/2011 T-CD4SP ([facility]) (CD4SP) ... 02/16/2011 T-Comprehensive Metabolic Panel [phone]) ... 02/16/2011  Medications Added to Medication List This Visit: 1)  Kaletra 200-50 Mg Tabs (Lopinavir-ritonavir) .... Discontinue kaletra 2)  Isentress 400 Mg Tabs (Raltegravir potassium) .... Take one (1) tablet every twelve (12) hours  Other Orders: Future Orders: T-Hepatitis C Viral Load ([id number]) ... 02/16/2011 T-HIV Viral Load [id number]) ... 02/16/2011 T-Lipid Profile [phone]) ... 02/16/2011  Patient Instructions: 1)  Stop Kaletra 2)  Start isentress 400mg  1 tablet by mouth every 12 hours 3)  Continue Sustiva 4)  Please schedule a follow-up appointment in 6 weeks. 5)  Be sure to return for lab work two (2) weeks  before your next appointment as scheduled. 6)  Advised not to eat any food or drink any liquids after 10 PM the night before labs. Prescriptions: ISENTRESS 400 MG TABS (RALTEGRAVIR POTASSIUM) Take one (1) tablet every twelve (12) hours  #60 x 2  Entered and Authorized by: Talmadge Chad NP  Signed by: Talmadge Chad NP on 01/19/2011  Method used: Print then Give to Patient  RxID: [id number] KALETRA 200-50 MG TABS (LOPINAVIR-RITONAVIR) DISCONTINUE KALETRA  #1 x 0  Entered and Authorized by: Talmadge Chad NP  Signed by: Talmadge Chad NP on 01/19/2011  Method used: Print then Give to Patient  RxID: [id number]    Orders Added: 1)  Est. Patient Level III [16606] 2)  T-CBC w/Diff [[id number]] 3)  T-CD4SP [facility]) [CD4SP] 4)  T-Comprehensive Metabolic Panel [[id number]] 5)  T-Hepatitis C Viral Load [[id number]] 6)  T-HIV Viral Load [[id number]] 7)  T-Lipid Profile [[id number]]        Signed by Talmadge Chad NP on 04/18/2011 at 2:14 PM  ________________________________________________________________________

## 2011-05-05 NOTE — Discharge Summary (Signed)
Roy Gonzalez, BAKOS               ACCOUNT NO.:  [id number]   MEDICAL RECORD NO.:  [id number]          PATIENT TYPE:  INP   LOCATION:  3016                         FACILITY:  [facility]   PHYSICIAN:  Roy Beaver, MD       DATE OF BIRTH:  20-Dec-1965   DATE OF ADMISSION:  01/26/2005  DATE OF DISCHARGE:  01/28/2005                                 DISCHARGE SUMMARY   DISCHARGE DIAGNOSES:  1.  Weakness and lower extremity paralysis secondary to profound      hypokalemia.  2.  Hypokalemia.  3.  Acute renal failure secondary to tenofovir.  4.  Human Immunodeficiency Virus positive.   DISCHARGE MEDICATIONS:  Potassium chloride 40 mEq in the morning and at  night.   FOLLOWUP APPOINTMENT:  Dr. Roxan Gonzalez on January 30, 2005.   BRIEF ADMISSION H&P:  Mr. Roy Gonzalez is a 45 year old man with a history of HIV,  hepatitis C, who is admitted on January 26, 2005 with a chief complaint of  progressive weakness in a setting of increased urinary frequency.  Mr. Roy Gonzalez  had recently started tenofovir several days prior to admission, he was seen  in the ED at [facility] and was found to have a potassium of less than 2,  he was transferred to [facility] to be admitted and cared for by  the Teaching Service.  On presentation he was afebrile with a pulse of 82,  blood pressure 126/67, respirations 15, O2 saturation 100% on room air.  No  acute distress.  Alert and oriented x3.  Pupils equal, round, react to light  and accommodation.  Extraocular movements intact.  Oropharynx clear.  Neck  was supple, no JVD or thyromegaly.  Clear to auscultation bilaterally.  No  wheezes, rales or rhonchi, regular rate and rhythm, no murmurs, rubs or  gallops, positive bowel sounds, nontender, nondistended.  No peripheral  edema, cords or tenderness.  No skin rashes or lymphadenopathy.  Cranial  nerves II-XII grossly intact with no focal deficits.  Strength in his lower  extremities was 3/5 bilaterally.  Strength in  upper extremities 4/5  bilaterally.  Reflexes 2+ and symmetric, no Babinski.   ADMISSION LABS:  Sodium 142, potassium 1.6, chloride 108, bicarb 19, BUN 19,  creatinine 3.4, glucose 112, baseline creatinine 2.0 in January 2006, AST  18, ALT 13, total protein 8.5, TSH 2.079, alkaline phosphatase 360, CD4  count from January 2006 470 and viral load less than 50.   HOSPITAL COURSE:  1.  Hypokalemia with markedly lower extremity weakness.  This was felt most      likely secondary to auto diuresis from acute renal failure probably      acute tubular necrosis secondary to tenofovir toxicity.  Potassium was      repleted aggressively requiring multiple IV runs as well as oral      repletion.  By hospital day two his potassium returned to normal range      and his urine output is normalizing.  His creatinine with IV rehydration      returned to 2.1 and his tenofovir was  held.  He regained strength      dramatically after his potassium normalized.  2.  Acute renal failure.  Creatinine was clearly elevated from his baseline      of 1.4 to 2, the creatinine did return back to his baseline range at      discharge and will be followed up in the outpatient setting.  His      medication regimen will have to be adjusted based on this likely      toxicity secondary to tenofovir.  3.  Human Immunodeficiency Virus.  CD4 count as mentioned above.  This will      be followed in the Infectious Disease clinic by Dr. Roxan Gonzalez.   LABS AT DISCHARGE:  Sodium 142, potassium 4, chloride 119, bicarb 19, BUN 9,  creatinine 2.3, glucose 90.      BM/MEDQ  D:  03/31/2005  T:  03/31/2005  Job:  [id number]

## 2011-05-05 NOTE — Op Note (Signed)
NAME:  Roy Gonzalez, Roy Gonzalez                         ACCOUNT NO.:  [id number]   MEDICAL RECORD NO.:  [id number]                   PATIENT TYPE:  INP   LOCATION:  5016                                 FACILITY:  [facility]   PHYSICIAN:  Burnard Bunting, M.D.                 DATE OF BIRTH:  1966/04/20   DATE OF PROCEDURE:  06/07/2004  DATE OF DISCHARGE:                                 OPERATIVE REPORT   PREOPERATIVE DIAGNOSIS:  Left hip avascular necrosis.   POSTOPERATIVE DIAGNOSIS:  Left hip avascular necrosis.   PROCEDURE:  Left total hip replacement.   SURGEON:  Burnard Bunting, M.D.   ASSISTANT:  Jerolyn Shin. Tresa Res, M.D.   ANESTHESIA:  General endotracheal anesthesia.   ESTIMATED BLOOD LOSS:  350 mL.   DRAINS:  Drains.   PROCEDURE IN DETAIL:  The patient was brought to the operating room where  general endotracheal anesthesia was induced.  Preoperative IV antibiotics  were administered.  The patient was placed in the left lateral decubitus  position with the right peroneal nerve well padded.  The left foot, leg, and  hip were prepped with DuraPrep solution and draped in a sterile manner.  The  operative field was covered with Ioban.  A posterior approach to the hip was  utilized.  The skin and subcutaneous tissue were sharply divided.  The  fascia lata was encountered, divided, and then split in line with the fibers  of the gluteus maximus bluntly.  Bleeding points encountered were stopped  with electrocautery.  At this time, bursectomy was performed.  The  piriformis tendon was identified and was detached along with external  rotators from the hip capsule.  The hip capsule was identified.  The sciatic  nerve was palpated at this time and protected at all times during the case.  A retractor was placed in the acetabulum.  This was used to measure leg  length by placing a pin into the greater trochanter.  A 60 mm measurement  was obtained.  The pin was removed.  The capsulotomy was performed  and each  end was tagged with #1 Vicryl suture.  The head was dislocated and cut a  fingerbreadth above the lesser trochanter.  The acetabular labrum was then  resected.  The socket was then reamed up to a size 50 mm.  A trial socket  was placed along with a trial liner.  At this time, the femur was broached  to a size 2 Accolade trial stem.  The hip was reduced and found to be stable  in external rotation.  The position of the sleeve and 90 degrees of hip  flexion and 60 degrees of abduction and 60 degrees of internal rotation.  At  this time, the broach was removed.  The canal was prepared to accept a size  4 broach.  This was done with the Accolade size 4 broach.  With  the broach  in position, the cup was removed.  A press fit acetabular cup was then  placed.  A 0 degree liner was placed.  The size 4 broach was re-reduced and  was found to be stable in external rotation and extension, 90 degrees hip  flexion, and 60 degrees of internal rotation.  The leg lengths were  approximately equal.  At this time, the true prosthesis was placed and a  ceramic head was tapped into position.  The same stability parameters were  maintained.  The incision was irrigated with pulsatile irrigation.  The  capsule was closed using #1 Vicryl suture.  The fascia lata was closed using  interrupted inverted #1 Vicryl suture.  The skin was closed using  interrupted inverted 0 Vicryl and 2-0 Vicryl suture followed by skin staples  to reapproximate the skin  edges.  An impervious dressing was placed.  A knee immobilizer was placed.  The leg lengths were approximately equal at the completion of the case,  dorsiflexion was observed.  The patient tolerated the procedure well without  immediate complications.                                               Burnard Bunting, M.D.    GSD/MEDQ  D:  06/07/2004  T:  06/08/2004  Job:  [id number]

## 2011-05-05 NOTE — Discharge Summary (Signed)
[place]  Patient:    Roy Gonzalez, Roy Gonzalez                      MRN: [id number] Adm. Date:  [id number] Disc. Date: [id number] Attending:  Phifer, Harriett Sine [place] Dictator:   Marisue Brooklyn, M.D. CC:         [facility]  Rockey Situ. Flavia Shipper., M.D., Infectious Diseases   Discharge Summary  DATE OF BIRTH:  1966/08/11.  DISCHARGE DIAGNOSES: 1. Pneumocystis carinii pneumonia. 2. Acquired immunodeficiency deficiency syndrome.  DISCHARGE MEDICATIONS: 1. ______ Trizivir 1 tablet 2 times a day. 2. Dapsone 100 mg 1 tablet q.d. 3. Clindamycin 450 mg 2 tablets t.i.d. 4. Primaquine 30 mg 1 tablet once a day. 5. Prednisone 40 mg twice a day for 3 days, then 40 mg once a day for 5 days,    20 mg once a day for 11 days. 6. The total clindamycin, Primaquine, prednisone dosage is for 21 days.  FOLLOW-UP:  Return for hospital follow-up on Friday, July 27, 2000 at 2:00 p.m. with Dr. Bettye Boeck.  For any questions, the patient is instructed to call [phone].  The patient is scheduled for a continuity clinic follow-up with Dr. Lenn Sink in one months time.  The patient has been given further instructions.  CONSULTATIONS:  Lenn Sink, M.D., infectious diseases.  PROCEDURES:  None.  CHIEF COMPLAINT ON ADMISSION:  Dry cough for one week.  HISTORY OF PRESENT ILLNESS:  Roy Gonzalez is a 45 year old male with AIDS with a CD4 count of 50 and a viral load of more than 75,000 done in July 2001.  He has a history of poor medical compliance.  He has been a patient of Dr. Nelva Bush and was diagnosed with Pneumocystis carinii pneumonia in April of 2000, and he was treated initially with trimethoprim Sulphamethoxazole, and he developed a fever and a rash to that, following which he was treated with Dapsone.  The patient had not been keeping up with his appointments at the clinic, and he came back to the clinic after one year of the  diagnosis with PCP.  He now presented with one week of dry, nonproductive cough, night sweats.  He denied shaking chills or rigors and subjective fevers.  He also denies shortness of breath, palpitations, nausea, vomiting, diarrhea, abdominal pain, chest pain, or pleuritic pain.  His review of systems was otherwise normal.  PAST MEDICAL HISTORY: 1. HIV. 2. Hepatitic C diagnosed in April 2000. 3. History of Pneumocystis carinii pneumonia in April 2000. 4. History of thrush.  FAMILY HISTORY:  Noncontributory.  SOCIAL HISTORY:  He is a Technical brewer.  He denies any history of sexual activity for the past one year.  ALLERGIES:  No known drug allergies.  MEDICATIONS: 1. ______ Trizivir 1 tablet once a day. 2. Dapsone 100 mg once a day. 3. Diflucan 100 mg q.d. p.r.n.  PHYSICAL EXAMINATION:  VITAL SIGNS:  Temperature 99.6, pulse 92, respiratory rates 18, and blood pressure 140/67, oxygen saturations 92% on room air.  GENERAL:  He is alert and oriented x 3 in no apparent distress.  HEENT:  Pupils, equal, round, reactive to light and accommodation. Extraocular movements intact.  Tympanic membranes clear.  NECK:  Supple.  No jugular venous distension, no carotid bruits, no thyromegaly, no lymphadenopathy.  CARDIOVASCULAR:  S1, S2 present.  Rate and rhythm regular without murmur, rub or gallops.  LUNGS:  Clear to auscultation  bilaterally, no adventitious sounds, bilateral equal air entry.  ABDOMEN:  Soft, nontender, nondistended.  Bowel sounds normal.  No renal masses, no hepatosplenomegaly.  EXTREMITIES:  No evidence of cyanosis, clubbing, or pedal edema.  Pulses are 2+ and intact.  NEUROLOGIC:  Cranial nerves 2-12 are grossly intact.  No evidence of focal neurologic deficits.  ADMISSION LABORATORIES:  WBC 2.7, hemoglobin 10.8, hematocrit 31.6, MCV 96.3, platelets 170, sodium 138, potassium 3.0, chloride 104, bicarbonate 29, BUN  8, creatinine .8, glucose 78, AST 25, ALT 14, alkaline phosphatase 54, bilirubin .8, total protein 7.4, albumin 3.0, calcium 8.7.  LDH 549 increased. Chest x-ray showed Pneumocystis lung disease and 1.5 cm right apical nodule.  HOSPITAL COURSE:  PNEUMOCYSTIS CARINII PNEUMONIA:  Mr. Diamant was diagnosed with Pneumocystis carinii pneumonia with evidence of cough, a low CD4 count, and an increased LDH level of 549.  He was empirically started on Clindamycin, Primaquine, and prednisone, as his ABG showed an AA gradient of more than 35. His ABG was 7.44 pH, PCO2 was 38.1, PO2 75.8, and bicarb was 25.4.  His sputum has been collected for a ______ staining for PCP BFA and for sputum culture. The results are pending.  His LDH on day two was 501 and on day three, that is the day of discharge, it went down to 422.  Apparently he has been responding to the treatment Pneumocystis carinii.  His sputum also showed abundant WBCs predominantly polymorphonuclear leukocytes, Gram positive cocci and chains, spares and clusters, moderate Gram positive rods and Gram negative rods. Sputum was negative for fungus.  DISPOSITION:  He is discharged home.  He is scheduled to come back for a follow-up with me, Dr. Bettye Boeck, and later during the month with Dr. Lenn Sink.  CONDITION ON DISCHARGE:  Stable.  DIET:  Regular.  RESIDENT PHYSICIANS:  Marcelino Duster, M.D.  Marisue Brooklyn, M.D. DD:  07/23/00 TD:  07/23/00 Job: [id number]

## 2011-05-05 NOTE — Discharge Summary (Signed)
NAME:  MICKIE, KOZIKOWSKI                         ACCOUNT NO.:  [id number]   MEDICAL RECORD NO.:  [id number]                   PATIENT TYPE:  INP   LOCATION:  5016                                 FACILITY:  [facility]   PHYSICIAN:  Burnard Bunting, M.D.                 DATE OF BIRTH:  June 10, 1966   DATE OF ADMISSION:  06/07/2004  DATE OF DISCHARGE:  06/11/2004                                 DISCHARGE SUMMARY   DISCHARGE DIAGNOSES:  1. Left hip pain.  2. Human immunodeficiency virus positive.  3. Hepatitis.   HOSPITAL COURSE:  Christipher Rieger is a 45 year old patient with left hip  avascular necrosis who presents for a total hip arthroplasty for end-stage  arthritis.  The patient underwent left arthroplasty on June 07, 2004.  He  tolerated the procedure well without any immediate complication.  He was  started on Coumadin for DVT prophylaxis.  He was started on physical therapy  mobilizing with touchdown weightbearing as tolerated.  Hematocrit was 36 on  postop day #1, and creatinine was 1.3.  Incision was intact on postop day  #2.  Dressing was changed at this time.  The patient had an otherwise  unremarkable recovery.  Postop x-ray showed good component alignment  position.  At this time, he will be discharged to home in good condition.   DISCHARGE MEDICATIONS:  1. Coumadin.  2. Percocet.  3. Preadmission medications.   FOLLOW UP:  Follow up with Dr. August Saucer in 7 days for suture removal.                                                Burnard Bunting, M.D.    GSD/MEDQ  D:  07/12/2004  T:  07/12/2004  Job:  [id number]

## 2011-06-12 ENCOUNTER — Other Ambulatory Visit: Payer: Self-pay | Admitting: *Deleted

## 2011-06-12 DIAGNOSIS — B20 Human immunodeficiency virus [HIV] disease: Secondary | ICD-10-CM

## 2011-06-12 MED ORDER — RALTEGRAVIR POTASSIUM 400 MG PO TABS: 400.0000 mg | ORAL_TABLET | Freq: Two times a day (BID) | ORAL | Status: DC

## 2011-06-12 MED ORDER — EFAVIRENZ 600 MG PO TABS: 600.0000 mg | ORAL_TABLET | Freq: Every day | ORAL | Status: DC

## 2011-09-26 LAB — T-HELPER CELL (CD4) - (RCID CLINIC ONLY)
CD4 % Helper T Cell: 35
CD4 T Cell Abs: 530

## 2012-01-03 ENCOUNTER — Telehealth: Payer: Self-pay | Admitting: *Deleted

## 2012-01-03 NOTE — Telephone Encounter (Signed)
Called and left message for patient to call the clinic to schedule an appointment for labs and follow up with a provider. Wendall Mola CMA

## 2012-01-05 ENCOUNTER — Telehealth: Payer: Self-pay | Admitting: *Deleted

## 2012-01-05 NOTE — Telephone Encounter (Signed)
Pt received Flu Vaccine at [facility] on [address]. In [place] on 12/28/11.  Fax will be scanned.

## 2016-03-18 ENCOUNTER — Encounter: Payer: Self-pay | Admitting: Emergency Medicine

## 2016-03-18 ENCOUNTER — Emergency Department: Payer: 59

## 2016-03-18 ENCOUNTER — Emergency Department
Admission: EM | Admit: 2016-03-18 | Discharge: 2016-03-18 | Disposition: A | Discharge status: Home / Self Care | Payer: 59 | Attending: Emergency Medicine | Admitting: Emergency Medicine

## 2016-03-18 DIAGNOSIS — J069 Acute upper respiratory infection, unspecified: Secondary | ICD-10-CM | POA: Insufficient documentation

## 2016-03-18 DIAGNOSIS — B20 Human immunodeficiency virus [HIV] disease: Secondary | ICD-10-CM | POA: Diagnosis not present

## 2016-03-18 DIAGNOSIS — Z87891 Personal history of nicotine dependence: Secondary | ICD-10-CM | POA: Insufficient documentation

## 2016-03-18 DIAGNOSIS — Z79899 Other long term (current) drug therapy: Secondary | ICD-10-CM | POA: Diagnosis not present

## 2016-03-18 DIAGNOSIS — R05 Cough: Secondary | ICD-10-CM | POA: Diagnosis present

## 2016-03-18 HISTORY — DX: Human immunodeficiency virus (HIV) disease: B20

## 2016-03-18 HISTORY — DX: Asymptomatic human immunodeficiency virus (hiv) infection status: Z21

## 2016-03-18 MED ORDER — BENZONATATE 100 MG PO CAPS: 100.0000 mg | ORAL_CAPSULE | Freq: Three times a day (TID) | ORAL | Status: DC | PRN

## 2016-03-18 NOTE — ED Provider Notes (Signed)
[facility] Emergency Department Provider Note  ____________________________________________  Time seen: Approximately 8:02 AM  I have reviewed the triage vital signs and the nursing notes.   HISTORY  Chief Complaint Cough    HPI Roy Gonzalez is a 50 y.o. male patient complaining of cough and shortness of breath 2 weeks. He stated the cough is nonproductive. Patient has been intermittent fever and chills. Patient denies any nausea vomiting diarrhea. No palliative measures taken for this complaint. Patient's concern secondary to his immune status. Patient positive HIV. Patient denies any pain with this complaint.   Past Medical History  Diagnosis Date  . HIV (human immunodeficiency virus infection) [facility](HCC)     Patient Active Problem List   Diagnosis Date Noted  . THRUSH 10/23/2006  . PNEUMOCYSTIS PNEUMONIA 10/23/2006  . 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 RENAL DISEASE 09/18/2003  . 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 Human immunodeficiency virus (HIV) disease (HCC) 03/19/1999  . HEPATITIS C 03/19/1999    History reviewed. No pertinent past surgical history.  Current Outpatient Rx  Name  Route  Sig  Dispense  Refill  . benzonatate (TESSALON PERLES) 100 MG capsule   Oral   Take 1 capsule (100 mg total) by mouth 3 (three) times daily as needed for cough.   15 capsule   0   . efavirenz (SUSTIVA) 600 MG tablet   Oral   Take 1 tablet (600 mg total) by mouth at bedtime.   30 tablet   5   . raltegravir (ISENTRESS) 400 MG tablet   Oral   Take 1 tablet (400 mg total) by mouth 2 (two) times daily. 12 hours apart.   60 tablet   5     Allergies Review of patient's allergies indicates no known allergies.  No family history on file.  Social History Social History  Substance Use Topics  . Smoking status: Former Games developermoker  . Smokeless tobacco: None  . Alcohol Use: None    Review of Systems Constitutional: No  fever/chills Eyes: No visual changes. ENT: No sore throat. Cardiovascular: Denies chest pain. Respiratory: Dyspnea and nonproductive cough Gastrointestinal: No abdominal pain.  No nausea, no vomiting.  No diarrhea.  No constipation. Genitourinary: Negative for dysuria. Musculoskeletal: Negative for back pain. Skin: Negative for rash. Neurological: Negative for headaches, focal weakness or numbness. Allergic/Immunilogical: HIV 10-point ROS otherwise negative.  ____________________________________________   PHYSICAL EXAM:  VITAL SIGNS: ED Triage Vitals  Enc Vitals Group     BP 03/18/16 0751 107/64 mmHg     Pulse Rate 03/18/16 0751 120     Resp 03/18/16 0751 20     Temp 03/18/16 0751 98.4 F (36.9 C)     Temp Source 03/18/16 0751 Oral     SpO2 03/18/16 0751 95 %     Weight 03/18/16 0751 120 lb (54.432 kg)     Height 03/18/16 0751 5\' 6"  (1.676 m)     Head Cir --      Peak Flow --      Pain Score 03/18/16 0752 1     Pain Loc --      Pain Edu? --      Excl. in GC? --     Constitutional: Alert and oriented. Appears malaise Eyes: Conjunctivae are normal. PERRL. EOMI. Head: Atraumatic. Nose: No congestion/rhinnorhea. Mouth/Throat: Mucous membranes are moist.  Oropharynx non-erythematous. Neck: No stridor.  No cervical spine tenderness to palpation. Hematological/Lymphatic/Immunilogical: No cervical lymphadenopathy. Cardiovascular: Normal rate, regular rhythm. Grossly  normal heart sounds.  Good peripheral circulation. Tachycardic Respiratory: Normal respiratory effort.  No retractions. Lungs CTAB. Gastrointestinal: Soft and nontender. No distention. No abdominal bruits. No CVA tenderness. Musculoskeletal: No lower extremity tenderness nor edema.  No joint effusions. Neurologic:  Normal speech and language. No gross focal neurologic deficits are appreciated. No gait instability. Skin:  Skin is warm, dry and intact. No rash noted. Psychiatric: Mood and affect are normal. Speech  and behavior are normal.  ____________________________________________   LABS (all labs ordered are listed, but only abnormal results are displayed)  Labs Reviewed - No data to display ____________________________________________  EKG   ____________________________________________  RADIOLOGY  No acute findings on x-ray ____________________________________________   PROCEDURES  Procedure(s) performed: None  Critical Care performed: No  ____________________________________________   INITIAL IMPRESSION / ASSESSMENT AND PLAN / ED COURSE  Pertinent labs & imaging results that were available during my care of the patient were reviewed by me and considered in my medical decision making (see chart for details). Upper respiratory infection. Discussed negative x-ray findings with patient. Patient given discharge Instructions and a prescription for Tessalon Perles. Advised [facility] ____________________________________________   FINAL CLINICAL IMPRESSION(S) / ED DIAGNOSES  Final diagnoses:  URI (upper respiratory infection)      Joni Reining, PA-C 03/18/16 1610  Arnaldo Natal, MD 03/18/16 1549

## 2016-03-18 NOTE — Discharge Instructions (Signed)
Cough, Adult °A cough helps to clear your throat and lungs. A cough may last only 2-3 weeks (acute), or it may last longer than 8 weeks (chronic). Many different things can cause a cough. A cough may be a sign of an illness or another medical condition. °HOME CARE °· Pay attention to any changes in your cough. °· Take medicines only as told by your doctor. °· If you were prescribed an antibiotic medicine, take it as told by your doctor. Do not stop taking it even if you start to feel better. °· Talk with your doctor before you try using a cough medicine. °· Drink enough fluid to keep your pee (urine) clear or pale yellow. °· If the air is dry, use a cold steam vaporizer or humidifier in your home. °· Stay away from things that make you cough at work or at home. °· If your cough is worse at night, try using extra pillows to raise your head up higher while you sleep. °· Do not smoke, and try not to be around smoke. If you need help quitting, ask your doctor. °· Do not have caffeine. °· Do not drink alcohol. °· Rest as needed. °GET HELP IF: °· You have new problems (symptoms). °· You cough up yellow fluid (pus). °· Your cough does not get better after 2-3 weeks, or your cough gets worse. °· Medicine does not help your cough and you are not sleeping well. °· You have pain that gets worse or pain that is not helped with medicine. °· You have a fever. °· You are losing weight and you do not know why. °· You have night sweats. °GET HELP RIGHT AWAY IF: °· You cough up blood. °· You have trouble breathing. °· Your heartbeat is very fast. °  °This information is not intended to replace advice given to you by your health care provider. Make sure you discuss any questions you have with your health care provider. °  °Document Released: 08/17/2011 Document Revised: 08/25/2015 Document Reviewed: 02/10/2015 °Elsevier Interactive Patient Education ©2016 [employer] Inc. ° °Upper Respiratory Infection, Adult °Most upper respiratory  infections (URIs) are a viral infection of the air passages leading to the lungs. A URI affects the nose, throat, and upper air passages. The most common type of URI is nasopharyngitis and is typically referred to as "the common cold." °URIs run their course and usually go away on their own. Most of the time, a URI does not require medical attention, but sometimes a bacterial infection in the upper airways can follow a viral infection. This is called a secondary infection. Sinus and middle ear infections are common types of secondary upper respiratory infections. °Bacterial pneumonia can also complicate a URI. A URI can worsen asthma and chronic obstructive pulmonary disease (COPD). Sometimes, these complications can require emergency medical care and may be life threatening.  °CAUSES °Almost all URIs are caused by viruses. A virus is a type of germ and can spread from one person to another.  °RISKS FACTORS °You may be at risk for a URI if:  °· You smoke.   °· You have chronic heart or lung disease. °· You have a weakened defense (immune) system.   °· You are very young or very old.   °· You have nasal allergies or asthma. °· You work in crowded or poorly ventilated areas. °· You work in health care facilities or schools. °SIGNS AND SYMPTOMS  °Symptoms typically develop 2-3 days after you come in contact with a cold virus.   Most viral URIs last 7-10 days. However, viral URIs from the influenza virus (flu virus) can last 14-18 days and are typically more severe. Symptoms may include:  °· Runny or stuffy (congested) nose.   °· Sneezing.   °· Cough.   °· Sore throat.   °· Headache.   °· Fatigue.   °· Fever.   °· Loss of appetite.   °· Pain in your forehead, behind your eyes, and over your cheekbones (sinus pain). °· Muscle aches.   °DIAGNOSIS  °Your health care provider may diagnose a URI by: °· Physical exam. °· Tests to check that your symptoms are not due to another condition such as: °¨ Strep  throat. °¨ Sinusitis. °¨ Pneumonia. °¨ Asthma. °TREATMENT  °A URI goes away on its own with time. It cannot be cured with medicines, but medicines may be prescribed or recommended to relieve symptoms. Medicines may help: °· Reduce your fever. °· Reduce your cough. °· Relieve nasal congestion. °HOME CARE INSTRUCTIONS  °· Take medicines only as directed by your health care provider.   °· Gargle warm saltwater or take cough drops to comfort your throat as directed by your health care provider. °· Use a warm mist humidifier or inhale steam from a shower to increase air moisture. This may make it easier to breathe. °· Drink enough fluid to keep your urine clear or pale yellow.   °· Eat soups and other clear broths and maintain good nutrition.   °· Rest as needed.   °· Return to work when your temperature has returned to normal or as your health care provider advises. You may need to stay home longer to avoid infecting others. You can also use a face mask and careful hand washing to prevent spread of the virus. °· Increase the usage of your inhaler if you have asthma.   °· Do not use any tobacco products, including cigarettes, chewing tobacco, or electronic cigarettes. If you need help quitting, ask your health care provider. °PREVENTION  °The best way to protect yourself from getting a cold is to practice good hygiene.  °· Avoid oral or hand contact with people with cold symptoms.   °· Wash your hands often if contact occurs.   °There is no clear evidence that vitamin C, vitamin E, echinacea, or exercise reduces the chance of developing a cold. However, it is always recommended to get plenty of rest, exercise, and practice good nutrition.  °SEEK MEDICAL CARE IF:  °· You are getting worse rather than better.   °· Your symptoms are not controlled by medicine.   °· You have chills. °· You have worsening shortness of breath. °· You have brown or red mucus. °· You have yellow or brown nasal discharge. °· You have pain in your  face, especially when you bend forward. °· You have a fever. °· You have swollen neck glands. °· You have pain while swallowing. °· You have white areas in the back of your throat. °SEEK IMMEDIATE MEDICAL CARE IF:  °· You have severe or persistent: °¨ Headache. °¨ Ear pain. °¨ Sinus pain. °¨ Chest pain. °· You have chronic lung disease and any of the following: °¨ Wheezing. °¨ Prolonged cough. °¨ Coughing up blood. °¨ A change in your usual mucus. °· You have a stiff neck. °· You have changes in your: °¨ Vision. °¨ Hearing. °¨ Thinking. °¨ Mood. °MAKE SURE YOU:  °· Understand these instructions. °· Will watch your condition. °· Will get help right away if you are not doing well or get worse. °  °This information is not intended to replace   advice given to you by your health care provider. Make sure you discuss any questions you have with your health care provider. °  °Document Released: 05/30/2001 Document Revised: 04/20/2015 Document Reviewed: 03/11/2014 °Elsevier Interactive Patient Education ©2016 Elsevier Inc. ° °

## 2016-03-18 NOTE — ED Notes (Signed)
Discussed discharge instructions, prescriptions, and follow-up care with patient. No questions or concerns at this time. Pt stable at discharge.  

## 2016-03-18 NOTE — ED Notes (Signed)
Cough and sob x 2 weeks.  No distress at this time.

## 2018-01-16 ENCOUNTER — Encounter
Admission: RE | Admit: 2018-01-16 | Discharge: 2018-01-16 | Disposition: A | Discharge status: Home / Self Care | Payer: Commercial Managed Care - PPO | Source: Ambulatory Visit | Attending: Orthopedic Surgery | Admitting: Orthopedic Surgery

## 2018-01-16 ENCOUNTER — Other Ambulatory Visit: Payer: Self-pay

## 2018-01-16 DIAGNOSIS — Z01812 Encounter for preprocedural laboratory examination: Secondary | ICD-10-CM | POA: Diagnosis not present

## 2018-01-16 DIAGNOSIS — M87851 Other osteonecrosis, right femur: Secondary | ICD-10-CM | POA: Diagnosis not present

## 2018-01-16 HISTORY — DX: Idiopathic aseptic necrosis of right femur: M87.051

## 2018-01-16 HISTORY — DX: Idiopathic aseptic necrosis of left femur: M87.052

## 2018-01-16 LAB — SEDIMENTATION RATE: SED RATE: 6 mm/h (ref 0–20)

## 2018-01-16 LAB — PROTIME-INR
INR: 0.9
Prothrombin Time: 12.1 seconds (ref 11.4–15.2)

## 2018-01-16 LAB — URINALYSIS, ROUTINE W REFLEX MICROSCOPIC
Bilirubin Urine: NEGATIVE
Glucose, UA: NEGATIVE mg/dL
HGB URINE DIPSTICK: NEGATIVE
Ketones, ur: NEGATIVE mg/dL
LEUKOCYTES UA: NEGATIVE
NITRITE: NEGATIVE
PROTEIN: NEGATIVE mg/dL
SPECIFIC GRAVITY, URINE: 1.02 (ref 1.005–1.030)
pH: 5 (ref 5.0–8.0)

## 2018-01-16 LAB — BASIC METABOLIC PANEL
ANION GAP: 8 (ref 5–15)
BUN: 18 mg/dL (ref 6–20)
CHLORIDE: 104 mmol/L (ref 101–111)
CO2: 25 mmol/L (ref 22–32)
Calcium: 9.2 mg/dL (ref 8.9–10.3)
Creatinine, Ser: 1.38 mg/dL — ABNORMAL HIGH (ref 0.61–1.24)
GFR calc Af Amer: >60 mL/min (ref >60)
GFR, EST NON AFRICAN AMERICAN: 57 mL/min — AB (ref >60)
GLUCOSE: 97 mg/dL (ref 65–99)
POTASSIUM: 3.9 mmol/L (ref 3.5–5.1)
Sodium: 137 mmol/L (ref 135–145)

## 2018-01-16 LAB — CBC
HEMATOCRIT: 48.3 % (ref 40.0–52.0)
HEMOGLOBIN: 16.1 g/dL (ref 13.0–18.0)
MCH: 35.4 pg — AB (ref 26.0–34.0)
MCHC: 33.2 g/dL (ref 32.0–36.0)
MCV: 106.7 fL — AB (ref 80.0–100.0)
PLATELETS: 155 10*3/uL (ref 150–440)
RBC: 4.53 MIL/uL (ref 4.40–5.90)
RDW: 13.4 % (ref 11.5–14.5)
WBC: 5.2 10*3/uL (ref 3.8–10.6)

## 2018-01-16 LAB — TYPE AND SCREEN
ABO/RH(D): O POS
ANTIBODY SCREEN: NEGATIVE

## 2018-01-16 LAB — APTT: aPTT: 29 seconds (ref 24–36)

## 2018-01-16 LAB — SURGICAL PCR SCREEN
MRSA, PCR: NEGATIVE
Staphylococcus aureus: NEGATIVE

## 2018-01-16 NOTE — Patient Instructions (Signed)
Your procedure is scheduled on: Tuesday, January 29, 2018 Report to Same Day Surgery on the 2nd floor in the [facility]. To find out your arrival time, please call [phone] between 1PM - 3PM on: Monday, January 28, 2018  REMEMBER: Instructions that are not followed completely may result in serious medical risk, up to and including death; or upon the discretion of your surgeon and anesthesiologist your surgery may need to be rescheduled.  Do not eat food after midnight the night before your procedure.  No gum chewing or hard candies.  You may however, drink CLEAR liquids up to 2 hours before you are scheduled to arrive at the hospital for your procedure.  Do not drink clear liquids within 2 hours of the start of your surgery.  Clear liquids include: - water  - apple juice without pulp - clear gatorade - black coffee or tea (Do NOT add anything to the coffee or tea) Do NOT drink anything that is not on this list.  No Alcohol for 24 hours before or after surgery.  No Smoking including e-cigarettes for 24 hours prior to surgery. No chewable tobacco products for at least 6 hours prior to surgery. No nicotine patches on the day of surgery.  On the morning of surgery brush your teeth with toothpaste and water, you may rinse your mouth with mouthwash if you wish. Do not swallow any  toothpaste of mouthwash.  Notify your doctor if there is any change in your medical condition (cold, fever, infection).  Do not wear jewelry, make-up, hairpins, clips or nail polish.  Do not wear lotions, powders, or perfumes. You may wear deodorant.  Do not shave 48 hours prior to surgery. Men may shave face and neck.  Contacts and dentures may not be worn into surgery.  Do not bring valuables to the hospital. [facility] is not responsible for any belongings or valuables.   TAKE THESE MEDICATIONS THE MORNING OF SURGERY WITH A SIP OF WATER:  1.  Abacavir-dolutergravir-lamivudine  Use CHG  Soap or wipes as directed on instruction sheet.  On February 4:  Stop Anti-inflammatories such as Advil, Aleve, Ibuprofen, Motrin, Naproxen, Naprosyn, Goodie powder, or aspirin products. (May take Tylenol or Acetaminophen if needed.)  On February 4:  Stop ANY OVER THE COUNTER supplements until after surgery.   If you are being admitted to the hospital overnight, leave your suitcase in the car. After surgery it may be brought to your room.  Please call the number above if you have any questions about these instructions.

## 2018-01-17 LAB — URINE CULTURE: CULTURE: NO GROWTH

## 2018-01-29 ENCOUNTER — Inpatient Hospital Stay: Payer: Commercial Managed Care - PPO

## 2018-01-29 ENCOUNTER — Encounter
Admission: RE | Disposition: A | Discharge status: Home Health | Payer: Self-pay | Source: Ambulatory Visit | Attending: Orthopedic Surgery

## 2018-01-29 ENCOUNTER — Other Ambulatory Visit: Payer: Self-pay

## 2018-01-29 ENCOUNTER — Inpatient Hospital Stay
Admission: RE | Admit: 2018-01-29 | Discharge: 2018-02-01 | DRG: 470 | Disposition: A | Discharge status: Home Health | Payer: Commercial Managed Care - PPO | Source: Ambulatory Visit | Attending: Orthopedic Surgery | Admitting: Orthopedic Surgery

## 2018-01-29 ENCOUNTER — Encounter: Payer: Self-pay | Admitting: *Deleted

## 2018-01-29 ENCOUNTER — Inpatient Hospital Stay: Payer: Commercial Managed Care - PPO | Admitting: Anesthesiology

## 2018-01-29 DIAGNOSIS — F17210 Nicotine dependence, cigarettes, uncomplicated: Secondary | ICD-10-CM | POA: Diagnosis present

## 2018-01-29 DIAGNOSIS — Z21 Asymptomatic human immunodeficiency virus [HIV] infection status: Secondary | ICD-10-CM | POA: Diagnosis present

## 2018-01-29 DIAGNOSIS — R0902 Hypoxemia: Secondary | ICD-10-CM | POA: Diagnosis not present

## 2018-01-29 DIAGNOSIS — M879 Osteonecrosis, unspecified: Principal | ICD-10-CM | POA: Diagnosis present

## 2018-01-29 DIAGNOSIS — Z96642 Presence of left artificial hip joint: Secondary | ICD-10-CM | POA: Diagnosis present

## 2018-01-29 DIAGNOSIS — N179 Acute kidney failure, unspecified: Secondary | ICD-10-CM | POA: Diagnosis not present

## 2018-01-29 DIAGNOSIS — R000 Tachycardia, unspecified: Secondary | ICD-10-CM | POA: Diagnosis not present

## 2018-01-29 DIAGNOSIS — Z419 Encounter for procedure for purposes other than remedying health state, unspecified: Secondary | ICD-10-CM

## 2018-01-29 DIAGNOSIS — M87851 Other osteonecrosis, right femur: Secondary | ICD-10-CM | POA: Diagnosis present

## 2018-01-29 DIAGNOSIS — G8918 Other acute postprocedural pain: Secondary | ICD-10-CM

## 2018-01-29 DIAGNOSIS — E860 Dehydration: Secondary | ICD-10-CM | POA: Diagnosis not present

## 2018-01-29 DIAGNOSIS — Z79899 Other long term (current) drug therapy: Secondary | ICD-10-CM

## 2018-01-29 HISTORY — PX: TOTAL HIP ARTHROPLASTY: SHX124

## 2018-01-29 LAB — CBC
HEMATOCRIT: 44.3 % (ref 40.0–52.0)
Hemoglobin: 14.9 g/dL (ref 13.0–18.0)
MCH: 35.9 pg — AB (ref 26.0–34.0)
MCHC: 33.7 g/dL (ref 32.0–36.0)
MCV: 106.8 fL — AB (ref 80.0–100.0)
Platelets: 164 10*3/uL (ref 150–440)
RBC: 4.15 MIL/uL — AB (ref 4.40–5.90)
RDW: 12.9 % (ref 11.5–14.5)
WBC: 7.2 10*3/uL (ref 3.8–10.6)

## 2018-01-29 LAB — ABO/RH: ABO/RH(D): O POS

## 2018-01-29 LAB — CREATININE, SERUM
Creatinine, Ser: 1.66 mg/dL — ABNORMAL HIGH (ref 0.61–1.24)
GFR calc non Af Amer: 46 mL/min — ABNORMAL LOW (ref >60)
GFR, EST AFRICAN AMERICAN: 53 mL/min — AB (ref >60)

## 2018-01-29 SURGERY — ARTHROPLASTY, HIP, TOTAL, ANTERIOR APPROACH
Anesthesia: Spinal | Site: Hip | Laterality: Right | Wound class: Clean

## 2018-01-29 MED ORDER — CEFAZOLIN SODIUM-DEXTROSE 2-4 GM/100ML-% IV SOLN
2.0000 g | Freq: Once | INTRAVENOUS | Status: AC
  Administered 2018-01-29: 2 g via INTRAVENOUS

## 2018-01-29 MED ORDER — OXYCODONE HCL 5 MG PO TABS
10.0000 mg | ORAL_TABLET | ORAL | Status: DC | PRN
  Administered 2018-01-29 – 2018-01-31 (×5): 10 mg via ORAL
  Filled 2018-01-29 (×6): qty 2

## 2018-01-29 MED ORDER — OXYCODONE HCL 5 MG PO TABS
ORAL_TABLET | ORAL | Status: AC
  Filled 2018-01-29: qty 1

## 2018-01-29 MED ORDER — DIPHENHYDRAMINE HCL 12.5 MG/5ML PO ELIX: 12.5000 mg | ORAL_SOLUTION | ORAL | Status: DC | PRN

## 2018-01-29 MED ORDER — MIDAZOLAM HCL 2 MG/2ML IJ SOLN
INTRAMUSCULAR | Status: AC
  Filled 2018-01-29: qty 2

## 2018-01-29 MED ORDER — LACTATED RINGERS IV SOLN
INTRAVENOUS | Status: DC
  Administered 2018-01-29: 10:00:00 via INTRAVENOUS

## 2018-01-29 MED ORDER — OXYCODONE HCL 5 MG PO TABS
5.0000 mg | ORAL_TABLET | Freq: Once | ORAL | Status: AC | PRN
  Administered 2018-01-29: 5 mg via ORAL

## 2018-01-29 MED ORDER — ZOLPIDEM TARTRATE 5 MG PO TABS: 5.0000 mg | ORAL_TABLET | Freq: Every evening | ORAL | Status: DC | PRN

## 2018-01-29 MED ORDER — FAMOTIDINE 20 MG PO TABS
20.0000 mg | ORAL_TABLET | Freq: Once | ORAL | Status: AC
  Administered 2018-01-29: 20 mg via ORAL

## 2018-01-29 MED ORDER — FENTANYL CITRATE (PF) 100 MCG/2ML IJ SOLN
INTRAMUSCULAR | Status: DC | PRN
  Administered 2018-01-29 (×2): 50 ug via INTRAVENOUS

## 2018-01-29 MED ORDER — MEPERIDINE HCL 50 MG/ML IJ SOLN: 6.2500 mg | INTRAMUSCULAR | Status: DC | PRN

## 2018-01-29 MED ORDER — MORPHINE SULFATE (PF) 2 MG/ML IV SOLN: 2.0000 mg | INTRAVENOUS | Status: DC | PRN

## 2018-01-29 MED ORDER — SODIUM CHLORIDE 0.9 % IV SOLN
INTRAVENOUS | Status: DC | PRN
  Administered 2018-01-29: 30 ug/min via INTRAVENOUS

## 2018-01-29 MED ORDER — METHOCARBAMOL 1000 MG/10ML IJ SOLN
500.0000 mg | Freq: Four times a day (QID) | INTRAVENOUS | Status: DC | PRN
  Filled 2018-01-29: qty 5

## 2018-01-29 MED ORDER — LIDOCAINE HCL (PF) 2 % IJ SOLN
INTRAMUSCULAR | Status: AC
Start: 2018-01-29 — End: ?
  Filled 2018-01-29: qty 10

## 2018-01-29 MED ORDER — GLYCOPYRROLATE 0.2 MG/ML IJ SOLN
INTRAMUSCULAR | Status: DC | PRN
  Administered 2018-01-29: 0.2 mg via INTRAVENOUS

## 2018-01-29 MED ORDER — INFLUENZA VAC SPLIT QUAD 0.5 ML IM SUSY: 0.5000 mL | PREFILLED_SYRINGE | INTRAMUSCULAR | Status: DC

## 2018-01-29 MED ORDER — CEFAZOLIN SODIUM-DEXTROSE 2-4 GM/100ML-% IV SOLN
2.0000 g | Freq: Four times a day (QID) | INTRAVENOUS | Status: AC
  Administered 2018-01-29 – 2018-01-30 (×3): 2 g via INTRAVENOUS
  Filled 2018-01-29 (×4): qty 100

## 2018-01-29 MED ORDER — PROMETHAZINE HCL 25 MG/ML IJ SOLN: 6.2500 mg | INTRAMUSCULAR | Status: DC | PRN

## 2018-01-29 MED ORDER — ALUM & MAG HYDROXIDE-SIMETH 200-200-20 MG/5ML PO SUSP: 30.0000 mL | ORAL | Status: DC | PRN

## 2018-01-29 MED ORDER — PNEUMOCOCCAL VAC POLYVALENT 25 MCG/0.5ML IJ INJ: 0.5000 mL | INJECTION | INTRAMUSCULAR | Status: DC

## 2018-01-29 MED ORDER — ONDANSETRON HCL 4 MG PO TABS: 4.0000 mg | ORAL_TABLET | Freq: Four times a day (QID) | ORAL | Status: DC | PRN

## 2018-01-29 MED ORDER — FENTANYL CITRATE (PF) 100 MCG/2ML IJ SOLN
INTRAMUSCULAR | Status: AC
  Filled 2018-01-29: qty 2

## 2018-01-29 MED ORDER — FENTANYL CITRATE (PF) 100 MCG/2ML IJ SOLN: 25.0000 ug | INTRAMUSCULAR | Status: DC | PRN

## 2018-01-29 MED ORDER — FAMOTIDINE 20 MG PO TABS
ORAL_TABLET | ORAL | Status: AC
  Filled 2018-01-29: qty 1

## 2018-01-29 MED ORDER — PROPOFOL 10 MG/ML IV BOLUS
INTRAVENOUS | Status: AC
Start: 2018-01-29 — End: ?
  Filled 2018-01-29: qty 20

## 2018-01-29 MED ORDER — BUPIVACAINE-EPINEPHRINE 0.25% -1:200000 IJ SOLN
INTRAMUSCULAR | Status: DC | PRN
  Administered 2018-01-29: 30 mL

## 2018-01-29 MED ORDER — OXYCODONE HCL 5 MG PO TABS
5.0000 mg | ORAL_TABLET | ORAL | Status: DC | PRN
  Administered 2018-01-29 – 2018-02-01 (×3): 5 mg via ORAL
  Filled 2018-01-29 (×3): qty 1

## 2018-01-29 MED ORDER — LIDOCAINE HCL (PF) 2 % IJ SOLN
INTRAMUSCULAR | Status: DC | PRN
  Administered 2018-01-29: 50 mg

## 2018-01-29 MED ORDER — BUPIVACAINE HCL (PF) 0.5 % IJ SOLN
INTRAMUSCULAR | Status: AC
  Filled 2018-01-29: qty 10

## 2018-01-29 MED ORDER — PHENOL 1.4 % MT LIQD
1.0000 | OROMUCOSAL | Status: DC | PRN
Start: 2018-01-29 — End: 2018-02-01
  Filled 2018-01-29: qty 177

## 2018-01-29 MED ORDER — SODIUM CHLORIDE 0.9 % IV SOLN
INTRAVENOUS | Status: DC
  Administered 2018-01-29 – 2018-01-30 (×2): via INTRAVENOUS

## 2018-01-29 MED ORDER — NEOMYCIN-POLYMYXIN B GU 40-200000 IR SOLN
Status: AC
  Filled 2018-01-29: qty 20

## 2018-01-29 MED ORDER — ENOXAPARIN SODIUM 40 MG/0.4ML ~~LOC~~ SOLN
40.0000 mg | SUBCUTANEOUS | Status: DC
  Administered 2018-01-30 – 2018-01-31 (×2): 40 mg via SUBCUTANEOUS
  Filled 2018-01-29 (×2): qty 0.4

## 2018-01-29 MED ORDER — METOCLOPRAMIDE HCL 10 MG PO TABS: 5.0000 mg | ORAL_TABLET | Freq: Three times a day (TID) | ORAL | Status: DC | PRN

## 2018-01-29 MED ORDER — CEFAZOLIN SODIUM-DEXTROSE 2-4 GM/100ML-% IV SOLN
INTRAVENOUS | Status: AC
  Filled 2018-01-29: qty 100

## 2018-01-29 MED ORDER — DOCUSATE SODIUM 100 MG PO CAPS
100.0000 mg | ORAL_CAPSULE | Freq: Two times a day (BID) | ORAL | Status: DC
  Administered 2018-01-29 – 2018-02-01 (×5): 100 mg via ORAL
  Filled 2018-01-29 (×6): qty 1

## 2018-01-29 MED ORDER — ABACAVIR-DOLUTEGRAVIR-LAMIVUD 600-50-300 MG PO TABS
1.0000 | ORAL_TABLET | Freq: Every day | ORAL | Status: DC
  Administered 2018-01-30 – 2018-02-01 (×3): 1 via ORAL
  Filled 2018-01-29 (×3): qty 1

## 2018-01-29 MED ORDER — MIDAZOLAM HCL 5 MG/5ML IJ SOLN
INTRAMUSCULAR | Status: DC | PRN
  Administered 2018-01-29 (×2): 1 mg via INTRAVENOUS
  Administered 2018-01-29: 2 mg via INTRAVENOUS

## 2018-01-29 MED ORDER — METOCLOPRAMIDE HCL 5 MG/ML IJ SOLN: 5.0000 mg | Freq: Three times a day (TID) | INTRAMUSCULAR | Status: DC | PRN

## 2018-01-29 MED ORDER — NEOMYCIN-POLYMYXIN B GU 40-200000 IR SOLN
Status: DC | PRN
  Administered 2018-01-29: 4 mL

## 2018-01-29 MED ORDER — GLYCOPYRROLATE 0.2 MG/ML IJ SOLN
INTRAMUSCULAR | Status: AC
  Filled 2018-01-29: qty 1

## 2018-01-29 MED ORDER — ACETAMINOPHEN 650 MG RE SUPP
650.0000 mg | RECTAL | Status: DC | PRN
Start: 2018-01-29 — End: 2018-02-01

## 2018-01-29 MED ORDER — TRANEXAMIC ACID 1000 MG/10ML IV SOLN
1000.0000 mg | INTRAVENOUS | Status: AC
  Administered 2018-01-29: 1000 mg via INTRAVENOUS
  Filled 2018-01-29: qty 10

## 2018-01-29 MED ORDER — MAGNESIUM CITRATE PO SOLN
1.0000 | Freq: Once | ORAL | Status: DC | PRN
  Filled 2018-01-29: qty 296

## 2018-01-29 MED ORDER — MENTHOL 3 MG MT LOZG
1.0000 | LOZENGE | OROMUCOSAL | Status: DC | PRN
  Filled 2018-01-29: qty 9

## 2018-01-29 MED ORDER — MAGNESIUM HYDROXIDE 400 MG/5ML PO SUSP
30.0000 mL | Freq: Every day | ORAL | Status: DC | PRN
  Administered 2018-01-30 – 2018-01-31 (×2): 30 mL via ORAL
  Filled 2018-01-29 (×2): qty 30

## 2018-01-29 MED ORDER — OXYCODONE HCL 5 MG/5ML PO SOLN: 5.0000 mg | Freq: Once | ORAL | Status: AC | PRN

## 2018-01-29 MED ORDER — ONDANSETRON HCL 4 MG/2ML IJ SOLN: 4.0000 mg | Freq: Four times a day (QID) | INTRAMUSCULAR | Status: DC | PRN

## 2018-01-29 MED ORDER — BISACODYL 10 MG RE SUPP: 10.0000 mg | Freq: Every day | RECTAL | Status: DC | PRN

## 2018-01-29 MED ORDER — ACETAMINOPHEN 325 MG PO TABS: 650.0000 mg | ORAL_TABLET | ORAL | Status: DC | PRN

## 2018-01-29 MED ORDER — PROPOFOL 500 MG/50ML IV EMUL
INTRAVENOUS | Status: DC | PRN
  Administered 2018-01-29: 25 ug/kg/min via INTRAVENOUS

## 2018-01-29 MED ORDER — METHOCARBAMOL 500 MG PO TABS: 500.0000 mg | ORAL_TABLET | Freq: Four times a day (QID) | ORAL | Status: DC | PRN

## 2018-01-29 MED ORDER — BUPIVACAINE HCL (PF) 0.5 % IJ SOLN
INTRAMUSCULAR | Status: DC | PRN
  Administered 2018-01-29: 3 mL via INTRATHECAL

## 2018-01-29 SURGICAL SUPPLY — 57 items
BLADE CLIPPER SURG (BLADE) ×1 IMPLANT
BLADE SAW SAG 18.5X105 (BLADE) ×2 IMPLANT
BNDG COHESIVE 6X5 TAN STRL LF (GAUZE/BANDAGES/DRESSINGS) ×6 IMPLANT
CANISTER SUCT 1200ML W/VALVE (MISCELLANEOUS) ×2 IMPLANT
CAPT HIP TOTAL 3 ×1 IMPLANT
CHLORAPREP W/TINT 26ML (MISCELLANEOUS) ×3 IMPLANT
DRAPE C-ARM XRAY 36X54 (DRAPES) ×2 IMPLANT
DRAPE INCISE IOBAN 66X60 STRL (DRAPES) IMPLANT
DRAPE POUCH INSTRU U-SHP 10X18 (DRAPES) ×2 IMPLANT
DRAPE SHEET LG 3/4 BI-LAMINATE (DRAPES) ×6 IMPLANT
DRAPE TABLE BACK 80X90 (DRAPES) ×2 IMPLANT
DRESSING SURGICEL FIBRLLR 1X2 (HEMOSTASIS) ×2 IMPLANT
DRSG OPSITE POSTOP 4X8 (GAUZE/BANDAGES/DRESSINGS) ×2 IMPLANT
DRSG SURGICEL FIBRILLAR 1X2 (HEMOSTASIS) ×4
ELECT BLADE 6.5 EXT (BLADE) ×2 IMPLANT
ELECT REM PT RETURN 9FT ADLT (ELECTROSURGICAL) ×2
ELECTRODE REM PT RTRN 9FT ADLT (ELECTROSURGICAL) ×1 IMPLANT
EVACUATOR 1/8 PVC DRAIN (DRAIN) IMPLANT
GLOVE BIOGEL PI IND STRL 7.0 (GLOVE) IMPLANT
GLOVE BIOGEL PI IND STRL 9 (GLOVE) ×1 IMPLANT
GLOVE BIOGEL PI INDICATOR 7.0 (GLOVE) ×2
GLOVE BIOGEL PI INDICATOR 9 (GLOVE) ×1
GLOVE SURG SYN 9.0  PF PI (GLOVE) ×2
GLOVE SURG SYN 9.0 PF PI (GLOVE) ×2 IMPLANT
GOWN SRG 2XL LVL 4 RGLN SLV (GOWNS) ×1 IMPLANT
GOWN STRL NON-REIN 2XL LVL4 (GOWNS) ×2
GOWN STRL REUS W/ TWL LRG LVL3 (GOWN DISPOSABLE) ×1 IMPLANT
GOWN STRL REUS W/TWL LRG LVL3 (GOWN DISPOSABLE) ×2
HOLDER FOLEY CATH W/STRAP (MISCELLANEOUS) ×2 IMPLANT
HOOD PEEL AWAY FLYTE STAYCOOL (MISCELLANEOUS) ×2 IMPLANT
KIT PREVENA INCISION MGT 13 (CANNISTER) ×2 IMPLANT
MAT BLUE FLOOR 46X72 FLO (MISCELLANEOUS) ×2 IMPLANT
NDL SAFETY ECLIPSE 18X1.5 (NEEDLE) ×1 IMPLANT
NDL SPNL 18GX3.5 QUINCKE PK (NEEDLE) ×1 IMPLANT
NEEDLE HYPO 18GX1.5 SHARP (NEEDLE) ×2
NEEDLE SPNL 18GX3.5 QUINCKE PK (NEEDLE) ×2 IMPLANT
NS IRRIG 1000ML POUR BTL (IV SOLUTION) ×2 IMPLANT
PACK HIP COMPR (MISCELLANEOUS) ×2 IMPLANT
SOL PREP PVP 2OZ (MISCELLANEOUS) ×2
SOLUTION PREP PVP 2OZ (MISCELLANEOUS) ×1 IMPLANT
SPONGE DRAIN TRACH 4X4 STRL 2S (GAUZE/BANDAGES/DRESSINGS) ×2 IMPLANT
STAPLER SKIN PROX 35W (STAPLE) ×2 IMPLANT
STRAP SAFETY 5IN WIDE (MISCELLANEOUS) ×2 IMPLANT
SUT DVC 2 QUILL PDO  T11 36X36 (SUTURE) ×1
SUT DVC 2 QUILL PDO T11 36X36 (SUTURE) ×1 IMPLANT
SUT SILK 0 (SUTURE) ×2
SUT SILK 0 30XBRD TIE 6 (SUTURE) ×1 IMPLANT
SUT V-LOC 90 ABS DVC 3-0 CL (SUTURE) ×2 IMPLANT
SUT VIC AB 1 CT1 36 (SUTURE) ×2 IMPLANT
SYR 20CC LL (SYRINGE) ×2 IMPLANT
SYR 30ML LL (SYRINGE) ×2 IMPLANT
SYR 5ML 18GX1 1/2 (NEEDLE) ×1 IMPLANT
SYR BULB IRRIG 60ML STRL (SYRINGE) ×2 IMPLANT
TAPE MICROFOAM 4IN (TAPE) ×2 IMPLANT
TOWEL OR 17X26 4PK STRL BLUE (TOWEL DISPOSABLE) ×2 IMPLANT
TRAY FOLEY W/METER SILVER 16FR (SET/KITS/TRAYS/PACK) ×2 IMPLANT
WND VAC CANISTER 500ML (MISCELLANEOUS) ×2 IMPLANT

## 2018-01-29 NOTE — H&P (Signed)
Reviewed paper H+P, will be scanned into chart. No changes noted.  

## 2018-01-29 NOTE — Transfer of Care (Signed)
Immediate Anesthesia Transfer of Care Note  Patient: Roy BoozerMichael A Gonzalez  Procedure(s) Performed: TOTAL HIP ARTHROPLASTY ANTERIOR APPROACH (Right Hip)  Patient Location: PACU  Anesthesia Type:Spinal  Level of Consciousness: awake, alert  and oriented  Airway & Oxygen Therapy: Patient Spontanous Breathing  Post-op Assessment: Report given to RN and Post -op Vital signs reviewed and stable  Post vital signs: Reviewed  Last Vitals:  Vitals:   01/29/18 0923 01/29/18 1309  BP: 137/79 (!) 91/58  Pulse: 85 83  Resp: 20 16  Temp: 36.4 C   SpO2: 100% 97%    Last Pain:  Vitals:   01/29/18 0923  TempSrc: Oral  PainSc: 2       Patients Stated Pain Goal: 0 (01/29/18 [id number])  Complications: No apparent anesthesia complications

## 2018-01-29 NOTE — NC FL2 (Signed)
[place] MEDICAID FL2 LEVEL OF CARE SCREENING TOOL     IDENTIFICATION  Patient Name: Roy Gonzalez Birthdate: 18-Nov-1966 Sex: male Admission Date (Current Location): 01/29/2018  [place]   Facility and Address:  [facility], [address], [place], [place] [address]      Provider Number: [id number]  Attending Physician Name and Address:  Kennedy BuckerMenz, [place], MD  Relative Name and Phone Number:       Current Level of Care: Hospital Recommended Level of Care: Skilled Nursing Facility Prior Approval Number:    Date Approved/Denied:   PASRR Number: ([id number])  Discharge Plan: SNF    Current Diagnoses: Patient Active Problem List   Diagnosis Date Noted  . Other osteonecrosis, right femur (HCC) 01/29/2018  . THRUSH 10/23/2006  . PNEUMOCYSTIS PNEUMONIA 10/23/2006  . 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 RENAL DISEASE 09/18/2003  . 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 Human immunodeficiency virus (HIV) disease (HCC) 03/19/1999  . HEPATITIS C 03/19/1999    Orientation RESPIRATION BLADDER Height & Weight     Self, Time, Situation, Place  Normal Continent Weight: 195 lb (88.5 kg) Height:  5\' 6"  (167.6 cm)  BEHAVIORAL SYMPTOMS/MOOD NEUROLOGICAL BOWEL NUTRITION STATUS      Continent Diet(Clear Liquid to be advanced)  AMBULATORY STATUS COMMUNICATION OF NEEDS Skin   Extensive Assist Verbally Surgical wounds(Incision Right Hip)                       Personal Care Assistance Level of Assistance  Bathing, Feeding, Dressing Bathing Assistance: Limited assistance Feeding assistance: Independent Dressing Assistance: Limited assistance     Functional Limitations Info  Sight, Hearing, Speech Sight Info: Adequate Hearing Info: Adequate Speech Info: Adequate    SPECIAL CARE FACTORS FREQUENCY  PT (By licensed PT), OT (By licensed OT)     PT Frequency: (5) OT Frequency: (5)             Contractures      Additional Factors Info  Code Status, Allergies Code Status Info: (Full Code) Allergies Info: (No Known Allergies)           Current Medications (01/29/2018):  This is the current hospital active medication list Current Facility-Administered Medications  Medication Dose Route Frequency Provider Last Rate Last Dose  . 0.9 %  sodium chloride infusion   Intravenous Continuous Kennedy BuckerMenz, Duwan, MD      . abacavir-dolutegravir-lamiVUDine (TRIUMEQ) 600-50-300 MG per tablet 1 tablet  1 tablet Oral Daily Kennedy BuckerMenz, Catarino, MD      . acetaminophen (TYLENOL) tablet 650 mg  650 mg Oral Q4H PRN Kennedy BuckerMenz, Azreal, MD       Or  . acetaminophen (TYLENOL) suppository 650 mg  650 mg Rectal Q4H PRN Kennedy BuckerMenz, Reshawn, MD      . alum & mag hydroxide-simeth (MAALOX/MYLANTA) 200-200-20 MG/5ML suspension 30 mL  30 mL Oral Q4H PRN Kennedy BuckerMenz, Samari, MD      . bisacodyl (DULCOLAX) suppository 10 mg  10 mg Rectal Daily PRN Kennedy BuckerMenz, Jimie, MD      . ceFAZolin (ANCEF) IVPB 2g/100 mL premix  2 g Intravenous Q6H Kennedy BuckerMenz, Emil, MD      . diphenhydrAMINE (BENADRYL) 12.5 MG/5ML elixir 12.5-25 mg  12.5-25 mg Oral Q4H PRN Kennedy BuckerMenz, Ramzy, MD      . docusate sodium (COLACE) capsule 100 mg  100 mg Oral BID Kennedy BuckerMenz, Abdulwahab, MD      . Melene Muller[START ON 01/30/2018] enoxaparin (LOVENOX) injection 40  mg  40 mg Subcutaneous Q24H Kennedy Bucker, MD      . famotidine (PEPCID) 20 MG tablet           . [START ON 01/30/2018] Influenza vac split quadrivalent PF (FLUARIX) injection 0.5 mL  0.5 mL Intramuscular Tomorrow-1000 Kennedy Bucker, MD      . magnesium citrate solution 1 Bottle  1 Bottle Oral Once PRN Kennedy Bucker, MD      . magnesium hydroxide (MILK OF MAGNESIA) suspension 30 mL  30 mL Oral Daily PRN Kennedy Bucker, MD      . menthol-cetylpyridinium (CEPACOL) lozenge 3 mg  1 lozenge Oral PRN Kennedy Bucker, MD       Or  . phenol (CHLORASEPTIC) mouth spray 1 spray  1 spray Mouth/Throat PRN Kennedy Bucker, MD      . methocarbamol (ROBAXIN)  tablet 500 mg  500 mg Oral Q6H PRN Kennedy Bucker, MD       Or  . methocarbamol (ROBAXIN) 500 mg in dextrose 5 % 50 mL IVPB  500 mg Intravenous Q6H PRN Kennedy Bucker, MD      . metoCLOPramide (REGLAN) tablet 5-10 mg  5-10 mg Oral Q8H PRN Kennedy Bucker, MD       Or  . metoCLOPramide (REGLAN) injection 5-10 mg  5-10 mg Intravenous Q8H PRN Kennedy Bucker, MD      . morphine 2 MG/ML injection 2 mg  2 mg Intravenous Q1H PRN Kennedy Bucker, MD      . ondansetron [facility]) tablet 4 mg  4 mg Oral Q6H PRN Kennedy Bucker, MD       Or  . ondansetron [facility]) injection 4 mg  4 mg Intravenous Q6H PRN Kennedy Bucker, MD      . oxyCODONE (Oxy IR/ROXICODONE) 5 MG immediate release tablet           . oxyCODONE (Oxy IR/ROXICODONE) immediate release tablet 10 mg  10 mg Oral Q3H PRN Kennedy Bucker, MD      . oxyCODONE (Oxy IR/ROXICODONE) immediate release tablet 5 mg  5 mg Oral Q3H PRN Kennedy Bucker, MD      . Melene Muller ON 01/30/2018] pneumococcal 23 valent vaccine (PNU-IMMUNE) injection 0.5 mL  0.5 mL Intramuscular Tomorrow-1000 Kennedy Bucker, MD      . zolpidem (AMBIEN) tablet 5 mg  5 mg Oral QHS PRN,MR X 1 Kennedy Bucker, MD         Discharge Medications: Please see discharge summary for a list of discharge medications.  Relevant Imaging Results:  Relevant Lab Results:   Additional Information (SSN: [id number])  Payton Spark, Student-Social Work

## 2018-01-29 NOTE — Progress Notes (Signed)
Pt received from PACU via hospital bed without incident per MD order. Rept taken from PACU RN. Pt oriented to room and unit routine. Pt instructed IS use. Pt denies pain on admission. Pt resting comfortably in bed on admission. Pt instructed pain control plan as ordered per his MD. Pt verbalizes understanding of all teachings. Will continue to monitor.

## 2018-01-29 NOTE — Op Note (Signed)
01/29/2018  1:15 PM  PATIENT:  Roy Gonzalez  52 y.o. male  PRE-OPERATIVE DIAGNOSIS:  avascular necrosis of bone of right hip  POST-OPERATIVE DIAGNOSIS:  avascular necrosis of bone of right hip  PROCEDURE:  Procedure(s): TOTAL HIP ARTHROPLASTY ANTERIOR APPROACH (Right)  SURGEON: Leitha SchullerMichael J Elpidio Thielen, MD  ASSISTANTS: none  ANESTHESIA:   spinal  EBL:  Total I/O In: 800 [I.V.:800] Out: 500 [Urine:200; Blood:300]  BLOOD ADMINISTERED:none  DRAINS: (2) Hemovact drain(s) in the Subcutaneous layer with  Suction Open   LOCAL MEDICATIONS USED:  MARCAINE     SPECIMEN:  Source of Specimen:  Right femoral head  DISPOSITION OF SPECIMEN:  PATHOLOGY  COUNTS:  YES  TOURNIQUET:  * No tourniquets in log *  IMPLANTS: Medacta AMIS 4 standard stem with 54 mm Mpact cup DM with liner, XL 28 mm metal head  DICTATION: .Dragon Dictation   The patient was brought to the operating room and after spinal anesthesia was obtained patient was placed on the operative table with the ipsilateral foot into the Medacta attachment, contralateral leg on a well-padded table. C-arm was brought in and preop template x-ray taken. After prepping and draping in usual sterile fashion appropriate patient identification and timeout procedures were completed. Anterior approach to the hip was obtained and centered over the greater trochanter and TFL muscle. The subcutaneous tissue was incised hemostasis being achieved by electrocautery. TFL fascia was incised and the muscle retracted laterally deep retractor placed. The lateral femoral circumflex vessels were identified and ligated. The anterior capsule was exposed and a capsulotomy performed. The neck was identified and a femoral neck cut carried out with a saw. The head was removed without difficulty and showed collapsed femoral head and extensive synovitis with somewhere to the acetabulum. Reaming was carried out to 54 mm and a T4 mm cup trial gave appropriate tightness to the  acetabular component a 54 DM cup was impacted into position. The leg was then externally rotated and ischiofemoral and pubofemoral releases carried out. The femur was sequentially broached to a size 4, size 4 standard with head trials were placed and the final components chosen. The 4 standard stem was inserted along with a XL metal 28 mm head and 54 mm liner. The hip was reduced and was stable the wound was thoroughly irrigated with fibrillar placed along the posterior capsule and medial neck. The deep fascia was closed using a heavy Quill after infiltration of 30 cc of quarter percent Sensorcaine with epinephrine. Subcutaneous drains were then inserted 3-0 v-loc used subcutaneously followed by skin staples and incisional wound VAC  PLAN OF CARE: Admit to inpatient

## 2018-01-29 NOTE — Anesthesia Preprocedure Evaluation (Signed)
Anesthesia Evaluation  Patient identified by MRN, date of birth, ID band Patient awake    Reviewed: Allergy & Precautions, NPO status , Patient's Chart, lab work & pertinent test results  History of Anesthesia Complications Negative for: history of anesthetic complications  Airway Mallampati: III  TM Distance: >3 FB Neck ROM: Full    Dental no notable dental hx.    Pulmonary neg sleep apnea, neg COPD, Current Smoker,    breath sounds clear to auscultation- rhonchi (-) wheezing      Cardiovascular Exercise Tolerance: Good (-) hypertension(-) CAD, (-) Past MI, (-) Cardiac Stents and (-) CABG  Rhythm:Regular Rate:Normal - Systolic murmurs and - Diastolic murmurs    Neuro/Psych negative neurological ROS  negative psych ROS   GI/Hepatic negative GI ROS, Neg liver ROS,   Endo/Other  negative endocrine ROSneg diabetes  Renal/GU negative Renal ROS     Musculoskeletal negative musculoskeletal ROS (+)   Abdominal (+) + obese,   Peds  Hematology negative hematology ROS (+)   Anesthesia Other Findings Past Medical History: No date: Avascular necrosis of bones of both hips (HCC) No date: HIV (human immunodeficiency virus infection) (HCC)   Reproductive/Obstetrics                             Lab Results  Component Value Date   WBC 5.2 01/16/2018   HGB 16.1 01/16/2018   HCT 48.3 01/16/2018   MCV 106.7 (H) 01/16/2018   PLT 155 01/16/2018    Anesthesia Physical Anesthesia Plan  ASA: II  Anesthesia Plan: Spinal   Post-op Pain Management:    Induction:   PONV Risk Score and Plan: 0 and Propofol infusion  Airway Management Planned: Natural Airway  Additional Equipment:   Intra-op Plan:   Post-operative Plan:   Informed Consent: I have reviewed the patients History and Physical, chart, labs and discussed the procedure including the risks, benefits and alternatives for the proposed  anesthesia with the patient or authorized representative who has indicated his/her understanding and acceptance.   Dental advisory given  Plan Discussed with: CRNA and Anesthesiologist  Anesthesia Plan Comments:         Anesthesia Quick Evaluation

## 2018-01-29 NOTE — Anesthesia Procedure Notes (Signed)
Spinal  Patient location during procedure: OR Start time: 01/29/2018 11:25 AM End time: 01/29/2018 11:29 AM Staffing Anesthesiologist: Emmie Niemann, MD Resident/CRNA: Rolla Plate, CRNA Performed: resident/CRNA  Preanesthetic Checklist Completed: patient identified, site marked, surgical consent, pre-op evaluation, timeout performed, IV checked, risks and benefits discussed and monitors and equipment checked Spinal Block Patient position: sitting Prep: ChloraPrep Patient monitoring: heart rate, continuous pulse ox, blood pressure and cardiac monitor Approach: midline Location: L4-5 Injection technique: single-shot Needle Needle type: Introducer and Pencil-Tip  Needle gauge: 24 G Needle length: 9 cm Additional Notes Negative paresthesia. Negative blood return. Positive free-flowing CSF. Expiration date of kit checked and confirmed. Patient tolerated procedure well, without complications.

## 2018-01-29 NOTE — Anesthesia Post-op Follow-up Note (Signed)
Anesthesia QCDR form completed.        

## 2018-01-30 ENCOUNTER — Encounter: Payer: Self-pay | Admitting: Orthopedic Surgery

## 2018-01-30 NOTE — Care Management Note (Signed)
Case Management Note  Patient Details  Name: Roy Gonzalez MRN: [id number] Date of Birth: [birth year]  Subjective/Objective:                  RNCM met with patient to discuss transition of care. He plans to return home at discharge and would like to use [facility]. He has a rolling walker and bedside commode from surgery 10 years ago.  He states he has support and transportation from his brothers.  Action/Plan: Home health agency list provided. Referral to [facility] per patient request. [phone] per Dr. MRudene Christiansrequest.  Expected Discharge Date:                  Expected Discharge Plan:     In-House Referral:     Discharge planning Services  CM Consult  Post Acute Care Choice:    Choice offered to:  Patient  DME Arranged:    DME Agency:     HH Arranged:  [facility]  Status of Service:  In process, will continue to follow  If discussed at Long Length of Stay Meetings, dates discussed:    Additional Comments:  AMarshell Garfinkel RN 01/30/2018, 10:51 AM

## 2018-01-30 NOTE — Progress Notes (Signed)
Physical Therapy Treatment Patient Details Name: Roy BoozerMichael A Sarris MRN: [id number] DOB: [birth year] Today's Date: 01/30/2018    History of Present Illness Pt is a 52 y.o. male s/p R THA anterior approach 01/29/18.  PMH includes HIV and avascular necrosis bone B hips.    PT Comments    Pt able to progress ambulation to 110 feet with RW CGA; pt steady and no c/o dizziness this afternoon.  Pt requiring intermittent vc's for gait technique and walker use.  Pain 4/10 R hip during session.  Will continue to progress pt with strengthening, increasing ambulation distance, and attempting stairs tomorrow as appropriate.    Follow Up Recommendations  Home health PT     Equipment Recommendations  Rolling walker with 5" wheels    Recommendations for Other Services       Precautions / Restrictions Precautions Precautions: Anterior Hip;Fall Precaution Booklet Issued: Yes (comment) Restrictions Weight Bearing Restrictions: Yes RLE Weight Bearing: Weight bearing as tolerated    Mobility  Bed Mobility Overal bed mobility: Needs Assistance Bed Mobility: Supine to Sit;Sit to Supine     Supine to sit: Supervision;HOB elevated Sit to supine: Supervision;HOB elevated   General bed mobility comments: increased effort and time of pt to perform on own  Transfers Overall transfer level: Needs assistance Equipment used: Rolling walker (2 wheeled) Transfers: Sit to/from Stand Sit to Stand: Min guard         General transfer comment: vc's for positioning and technique; increased effort to stand with RW; CGA for safety  Ambulation/Gait Ambulation/Gait assistance: Min guard Ambulation Distance (Feet): 110 Feet Assistive device: Standard walker Gait Pattern/deviations: Step-to pattern Gait velocity: decreased   General Gait Details: decreased stance time R LE; vc's for technique and to increase UE support through RW; vc's for gait pattern and walker use   Stairs            Wheelchair  Mobility    Modified Rankin (Stroke Patients Only)       Balance Overall balance assessment: Needs assistance Sitting-balance support: No upper extremity supported;Feet supported Sitting balance-Leahy Scale: Good Sitting balance - Comments: steady sitting reaching within BOS   Standing balance support: No upper extremity supported Standing balance-Leahy Scale: Good Standing balance comment: steady standing reaching within BOS (no UE support)                            Cognition Arousal/Alertness: Awake/alert Behavior During Therapy: WFL for tasks assessed/performed Overall Cognitive Status: Within Functional Limits for tasks assessed                                        Exercises Total Joint Exercises Long Arc Quad: AROM;Strengthening;Both;10 reps;Seated(vc's for positioning on edge of bed to perform R LAQ) General Exercises - Lower Extremity Hip Flexion/Marching: AROM;Left;AAROM;Right;Strengthening;10 reps;Seated    General Comments General comments (skin integrity, edema, etc.): R hip wound vac in place; R thigh hemovac already removed (pt reports removed by staff earlier).  Pt sleeping in bed upon PT arrival; agreeable to PT session once woken.      Pertinent Vitals/Pain Pain Assessment: 0-10 Pain Score: 4  Pain Location: R hip Pain Descriptors / Indicators: Sore Pain Intervention(s): Limited activity within patient's tolerance;Monitored during session;Premedicated before session;Repositioned  Vitals (HR and O2 on room air) stable and WFL throughout treatment session.    Home Living  Family/patient expects to be discharged to:: Private residence Living Arrangements: Other relatives(Pt's brother) Available Help at Discharge: Family Type of Home: House Home Access: Stairs to enter Entrance Stairs-Rails: Right;Left Home Layout: One level Home Equipment: Environmental consultant - 2 wheels;Cane - single point;Bedside commode;Shower seat;Grab bars -  tub/shower      Prior Function Level of Independence: Independent      Comments: Pt works as a IT sales professional at [employer].  Reports no falls in past 6 months.   PT Goals (current goals can now be found in the care plan section) Acute Rehab PT Goals Patient Stated Goal: to go home PT Goal Formulation: With patient Time For Goal Achievement: 02/13/18 Potential to Achieve Goals: Good Progress towards PT goals: Progressing toward goals    Frequency    BID      PT Plan Current plan remains appropriate    Co-evaluation              AM-PAC PT "6 Clicks" Daily Activity  Outcome Measure  Difficulty turning over in bed (including adjusting bedclothes, sheets and blankets)?: A Little Difficulty moving from lying on back to sitting on the side of the bed? : A Little Difficulty sitting down on and standing up from a chair with arms (e.g., wheelchair, bedside commode, etc,.)?: Unable Help needed moving to and from a bed to chair (including a wheelchair)?: A Little Help needed walking in hospital room?: A Little Help needed climbing 3-5 steps with a railing? : A Little 6 Click Score: 16    End of Session Equipment Utilized During Treatment: Gait belt Activity Tolerance: Patient tolerated treatment well Patient left: in bed;with call bell/phone within reach;with bed alarm set;with SCD's reapplied(B heels elevated via towel rolls) Nurse Communication: Mobility status;Precautions;Weight bearing status(via white board) PT Visit Diagnosis: Other abnormalities of gait and mobility (R26.89);Muscle weakness (generalized) (M62.81);Difficulty in walking, not elsewhere classified (R26.2);Pain Pain - Right/Left: Right Pain - part of body: Hip     Time: 1310-1341 PT Time Calculation (min) (ACUTE ONLY): 31 min  Charges:  $Gait Training: 8-22 mins $Therapeutic Activity: 8-22 mins                    G CodesHendricks Limes, PT 01/30/18, 2:02 PM [phone]

## 2018-01-30 NOTE — Progress Notes (Signed)
Clinical Social Worker (CSW) received SNF consult. PT is recommending home health. RN case manager aware of above. Please reconsult if future social work needs arise. CSW signing off.   Jorel Gravlin, LCSW [phone] 

## 2018-01-30 NOTE — Anesthesia Postprocedure Evaluation (Signed)
Anesthesia Post Note  Patient: Roy Gonzalez  Procedure(s) Performed: TOTAL HIP ARTHROPLASTY ANTERIOR APPROACH (Right Hip)  Patient location during evaluation: Nursing Unit Anesthesia Type: Spinal Level of consciousness: awake and alert and oriented Pain management: satisfactory to patient Vital Signs Assessment: post-procedure vital signs reviewed and stable Respiratory status: respiratory function stable Cardiovascular status: stable Postop Assessment: no headache, no backache, no apparent nausea or vomiting, spinal receding, patient able to bend at knees and adequate PO intake Anesthetic complications: no     Last Vitals:  Vitals:   01/30/18 0002 01/30/18 0403  BP: 132/73 105/65  Pulse: 99 97  Resp: 18 18  Temp: 37.1 C 37.1 C  SpO2: 100% 99%    Last Pain:  Vitals:   01/30/18 0559  TempSrc:   PainSc: 4                  Clydene PughBeane, Danylle Ouk D

## 2018-01-30 NOTE — Progress Notes (Signed)
   Subjective: 1 Day Post-Op Procedure(s) (LRB): TOTAL HIP ARTHROPLASTY ANTERIOR APPROACH (Right) Patient reports pain as 6 on 0-10 scale.   Patient is well, and has had no acute complaints or problems Denies any CP, SOB, ABD pain. We will continue therapy today.  Plan is to go home after hospital stay  Objective: Vital signs in last 24 hours: Temp:  [97 F (36.1 C)-99.3 F (37.4 C)] 99.3 F (37.4 C) (02/13 0755) Pulse Rate:  [51-110] 90 (02/13 0755) Resp:  [0-26] 18 (02/13 0755) BP: (79-142)/(37-84) 131/77 (02/13 0755) SpO2:  [97 %-100 %] 99 % (02/13 0755) Weight:  [88.5 kg (195 lb)] 88.5 kg (195 lb) (02/12 0923)  Intake/Output from previous day: 02/12 0701 - 02/13 0700 In: 2528.8 [P.O.:480; I.V.:1848.8; IV Piggyback:200] Out: 3000 [Urine:2700; Blood:300] Intake/Output this shift: No intake/output data recorded.  Recent Labs    01/29/18 1640  HGB 14.9   Recent Labs    01/29/18 1640  WBC 7.2  RBC 4.15*  HCT 44.3  PLT 164   Recent Labs    01/29/18 1640  CREATININE 1.66*   No results for input(s): LABPT, INR in the last 72 hours.  EXAM General - Patient is Alert, Appropriate and Oriented Extremity - Neurovascular intact Sensation intact distally Intact pulses distally Dorsiflexion/Plantar flexion intact No cellulitis present Compartment soft Dressing - dressing C/D/I and no drainage, hemovac and wound vac intact Motor Function - intact, moving foot and toes well on exam.   Past Medical History:  Diagnosis Date  . Avascular necrosis of bones of both hips (HCC)   . HIV (human immunodeficiency virus infection) (HCC)     Assessment/Plan:   1 Day Post-Op Procedure(s) (LRB): TOTAL HIP ARTHROPLASTY ANTERIOR APPROACH (Right) Active Problems:   Other osteonecrosis, right femur (HCC)  Estimated body mass index is 31.47 kg/m as calculated from the following:   Height as of this encounter: 5\' 6"  (1.676 m).   Weight as of this encounter: 88.5 kg (195  lb). Advance diet Up with therapy  Needs BM Recheck labs in the am CM to assist with discharge  DVT Prophylaxis - Lovenox, Foot Pumps and TED hose Weight-Bearing as tolerated to right leg   T. Cranston Neighborhris Shanigua Gibb, PA-C [facility] 01/30/2018, 8:05 AM

## 2018-01-30 NOTE — Evaluation (Signed)
Physical Therapy Evaluation Patient Details Name: Roy Gonzalez MRN: [id number] DOB: 08/03/1966 Today's Date: 01/30/2018   History of Present Illness  Pt is a 52 y.o. male s/p R THA anterior approach 01/29/18.  PMH includes HIV and avascular necrosis bone B hips.  Clinical Impression  Prior to hospital admission, pt was independent and working.  Pt lives with his brother in 1 level home with 4 steps to enter with B railings.  Currently pt is SBA supine to sit and CGA with transfers and walking a few steps with RW bed to chair (limited distance ambulating d/t pt c/o dizziness; resolved with sitting in chair; nursing notified).  Pt would benefit from skilled PT to address noted impairments and functional limitations (see below for any additional details).  Upon hospital discharge, recommend pt discharge to home with HHPT.    Follow Up Recommendations Home health PT    Equipment Recommendations  Rolling walker with 5" wheels    Recommendations for Other Services       Precautions / Restrictions Precautions Precautions: Anterior Hip;Fall Precaution Booklet Issued: Yes (comment) Restrictions Weight Bearing Restrictions: Yes RLE Weight Bearing: Weight bearing as tolerated      Mobility  Bed Mobility Overal bed mobility: Needs Assistance Bed Mobility: Supine to Sit     Supine to sit: Supervision;HOB elevated     General bed mobility comments: increased effort and time of pt to perform with vc's for technique  Transfers Overall transfer level: Needs assistance Equipment used: Rolling walker (2 wheeled) Transfers: Sit to/from Stand Sit to Stand: Min guard         General transfer comment: vc's for positioning and technique; increased effort to stand with RW; CGA for safety  Ambulation/Gait Ambulation/Gait assistance: Min guard Ambulation Distance (Feet): 3 Feet(bed to chair) Assistive device: Rolling walker (2 wheeled) Gait Pattern/deviations: Step-to pattern Gait  velocity: decreased   General Gait Details: decreased stance time R LE; vc's for technique and to increase UE support through RW; limited distance d/t c/o dizziness (resolved with rest sitting in chair)  Stairs            Wheelchair Mobility    Modified Rankin (Stroke Patients Only)       Balance Overall balance assessment: Needs assistance Sitting-balance support: No upper extremity supported;Feet supported Sitting balance-Leahy Scale: Good Sitting balance - Comments: steady sitting reaching within BOS   Standing balance support: Bilateral upper extremity supported Standing balance-Leahy Scale: Poor Standing balance comment: requires B UE support on RW for static standing balance                             Pertinent Vitals/Pain Pain Assessment: 0-10 Pain Score: 4  Pain Location: R hip Pain Descriptors / Indicators: Sore Pain Intervention(s): Limited activity within patient's tolerance;Monitored during session;Premedicated before session;Repositioned;Ice applied  Vitals (HR and O2 on room air) stable and WFL throughout treatment session.    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  Reports no falls in past 6 months.  Hand Dominance        Extremity/Trunk Assessment   Upper Extremity Assessment Upper Extremity Assessment: Overall WFL for tasks assessed    Lower Extremity Assessment Lower Extremity Assessment: RLE deficits/detail;LLE deficits/detail RLE  Deficits / Details: hip flexion at least 3-/5 (limited d/t pain); knee flexion/extension and DF at least 3/5 AROM RLE: Unable to fully assess due to pain LLE Deficits / Details: strength and ROM WFL    Cervical / Trunk Assessment Cervical / Trunk Assessment: Normal  Communication   Communication: No difficulties  Cognition Arousal/Alertness: Awake/alert Behavior During Therapy: WFL for tasks assessed/performed Overall Cognitive Status: Within Functional Limits for tasks assessed                                        General Comments General comments (skin integrity, edema, etc.): R hip wound vac and R thigh hemovac in place.  Nursing cleared pt for participation in physical therapy.  Pt agreeable to PT session.    Exercises Total Joint Exercises Ankle Circles/Pumps: AROM;Strengthening;Both;10 reps;Supine Quad Sets: AROM;Strengthening;Both;10 reps;Supine Short Arc Quad: AROM;Strengthening;Right;10 reps;Supine Heel Slides: AAROM;Strengthening;Right;10 reps;Supine Hip ABduction/ADduction: AAROM;Strengthening;Right;10 reps;Supine   Assessment/Plan    PT Assessment Patient needs continued PT services  PT Problem List Decreased strength;Decreased activity tolerance;Decreased balance;Decreased mobility;Decreased knowledge of use of DME;Decreased knowledge of precautions;Pain       PT Treatment Interventions DME instruction;Gait training;Stair training;Functional mobility training;Therapeutic activities;Therapeutic exercise;Balance training;Patient/family education    PT Goals (Current goals can be found in the Care Plan section)  Acute Rehab PT Goals Patient Stated Goal: to go home PT Goal Formulation: With patient Time For Goal Achievement: 02/13/18 Potential to Achieve Goals: Good    Frequency BID   Barriers to discharge        Co-evaluation               AM-PAC PT "6 Clicks" Daily Activity  Outcome Measure Difficulty turning over in bed  (including adjusting bedclothes, sheets and blankets)?: A Little Difficulty moving from lying on back to sitting on the side of the bed? : A Lot Difficulty sitting down on and standing up from a chair with arms (e.g., wheelchair, bedside commode, etc,.)?: Unable Help needed moving to and from a bed to chair (including a wheelchair)?: A Little Help needed walking in hospital room?: A Little Help needed climbing 3-5 steps with a railing? : A Lot 6 Click Score: 14    End of Session Equipment Utilized During Treatment: Gait belt Activity Tolerance: Patient tolerated treatment well Patient left: in chair;with call bell/phone within reach;with chair alarm set;with nursing/sitter in room;with SCD's reapplied(B heels elevated via towel rolls) Nurse Communication: Mobility status;Weight bearing status;Precautions;Other (comment)(Pt's pain status) PT Visit Diagnosis: Other abnormalities of gait and mobility (R26.89);Muscle weakness (generalized) (M62.81);Difficulty in walking, not elsewhere classified (R26.2);Pain Pain - Right/Left: Right Pain - part of body: Hip    Time: 0923-0959 PT Time Calculation (min) (ACUTE ONLY): 36 min   Charges:   PT Evaluation $PT Eval Low Complexity: 1 Low PT Treatments $Therapeutic Exercise: 8-22 mins   PT G CodesHendricks Limes, PT 01/30/18, 10:33 AM [phone]

## 2018-01-31 ENCOUNTER — Inpatient Hospital Stay: Payer: Commercial Managed Care - PPO

## 2018-01-31 DIAGNOSIS — R000 Tachycardia, unspecified: Secondary | ICD-10-CM

## 2018-01-31 LAB — CBC
HCT: 43.7 % (ref 40.0–52.0)
Hemoglobin: 14.7 g/dL (ref 13.0–18.0)
MCH: 36.2 pg — AB (ref 26.0–34.0)
MCHC: 33.6 g/dL (ref 32.0–36.0)
MCV: 107.7 fL — AB (ref 80.0–100.0)
Platelets: 123 10*3/uL — ABNORMAL LOW (ref 150–440)
RBC: 4.06 MIL/uL — ABNORMAL LOW (ref 4.40–5.90)
RDW: 13.1 % (ref 11.5–14.5)
WBC: 7 10*3/uL (ref 3.8–10.6)

## 2018-01-31 LAB — BASIC METABOLIC PANEL
Anion gap: 11 (ref 5–15)
BUN: 14 mg/dL (ref 6–20)
CALCIUM: 9.2 mg/dL (ref 8.9–10.3)
CO2: 24 mmol/L (ref 22–32)
CREATININE: 1.48 mg/dL — AB (ref 0.61–1.24)
Chloride: 105 mmol/L (ref 101–111)
GFR calc Af Amer: >60 mL/min (ref >60)
GFR calc non Af Amer: 52 mL/min — ABNORMAL LOW (ref >60)
GLUCOSE: 100 mg/dL — AB (ref 65–99)
Potassium: 4 mmol/L (ref 3.5–5.1)
Sodium: 140 mmol/L (ref 135–145)

## 2018-01-31 LAB — APTT: aPTT: 35 seconds (ref 24–36)

## 2018-01-31 LAB — HEPARIN LEVEL (UNFRACTIONATED): Heparin Unfractionated: 0.22 IU/mL — ABNORMAL LOW (ref 0.30–0.70)

## 2018-01-31 LAB — SURGICAL PATHOLOGY

## 2018-01-31 LAB — PROTIME-INR
INR: 1.01
Prothrombin Time: 13.2 seconds (ref 11.4–15.2)

## 2018-01-31 MED ORDER — TECHNETIUM TO 99M ALBUMIN AGGREGATED
3.9800 | Freq: Once | INTRAVENOUS | Status: AC | PRN
  Administered 2018-01-31: 3.98 via INTRAVENOUS

## 2018-01-31 MED ORDER — HEPARIN (PORCINE) IN NACL 100-0.45 UNIT/ML-% IJ SOLN
1400.0000 [IU]/h | INTRAMUSCULAR | Status: DC
  Administered 2018-01-31: 1400 [IU]/h via INTRAVENOUS
  Filled 2018-01-31: qty 250

## 2018-01-31 MED ORDER — ENOXAPARIN SODIUM 40 MG/0.4ML ~~LOC~~ SOLN: 40.0000 mg | SUBCUTANEOUS | 0 refills | Status: DC

## 2018-01-31 MED ORDER — OXYCODONE HCL 5 MG PO TABS: 5.0000 mg | ORAL_TABLET | ORAL | 0 refills | Status: DC | PRN

## 2018-01-31 MED ORDER — TECHNETIUM TC 99M DIETHYLENETRIAME-PENTAACETIC ACID
31.4400 | Freq: Once | INTRAVENOUS | Status: AC | PRN
  Administered 2018-01-31: 31.44 via RESPIRATORY_TRACT

## 2018-01-31 MED ORDER — HEPARIN BOLUS VIA INFUSION
5000.0000 [IU] | Freq: Once | INTRAVENOUS | Status: AC
Start: 2018-01-31 — End: 2018-01-31
  Administered 2018-01-31: 5000 [IU] via INTRAVENOUS
  Filled 2018-01-31: qty 5000

## 2018-01-31 NOTE — Progress Notes (Signed)
   Subjective: 2 Days Post-Op Procedure(s) (LRB): TOTAL HIP ARTHROPLASTY ANTERIOR APPROACH (Right) Patient reports pain as mild.   Patient is well, and has had no acute complaints or problems Denies any CP, SOB, ABD pain. We will continue therapy today.  Plan is to go home after hospital stay  Objective: Vital signs in last 24 hours: Temp:  [98.3 F (36.8 C)-99 F (37.2 C)] 98.7 F (37.1 C) (02/14 0744) Pulse Rate:  [88-93] 88 (02/14 0744) Resp:  [16] 16 (02/14 0744) BP: (112-140)/(66-82) 124/66 (02/14 0744) SpO2:  [97 %-100 %] 100 % (02/14 0744)  Intake/Output from previous day: 02/13 0701 - 02/14 0700 In: 360 [P.O.:360] Out: 1000 [Urine:1000] Intake/Output this shift: No intake/output data recorded.  Recent Labs    01/29/18 1640 01/31/18 0410  HGB 14.9 14.7   Recent Labs    01/29/18 1640 01/31/18 0410  WBC 7.2 7.0  RBC 4.15* 4.06*  HCT 44.3 43.7  PLT 164 123*   Recent Labs    01/29/18 1640 01/31/18 0410  NA  --  140  K  --  4.0  CL  --  105  CO2  --  24  BUN  --  14  CREATININE 1.66* 1.48*  GLUCOSE  --  100*  CALCIUM  --  9.2   No results for input(s): LABPT, INR in the last 72 hours.  EXAM General - Patient is Alert, Appropriate and Oriented Extremity - Neurovascular intact Sensation intact distally Intact pulses distally Dorsiflexion/Plantar flexion intact No cellulitis present Compartment soft Dressing - dressing C/D/I and no drainage, hemovac revmoved and wound vac intact without any drainage Motor Function - intact, moving foot and toes well on exam.   Past Medical History:  Diagnosis Date  . Avascular necrosis of bones of both hips (HCC)   . HIV (human immunodeficiency virus infection) (HCC)     Assessment/Plan:   2 Days Post-Op Procedure(s) (LRB): TOTAL HIP ARTHROPLASTY ANTERIOR APPROACH (Right) Active Problems:   Other osteonecrosis, right femur (HCC)  Estimated body mass index is 31.47 kg/m as calculated from the  following:   Height as of this encounter: 5\' 6"  (1.676 m).   Weight as of this encounter: 88.5 kg (195 lb). Advance diet Up with therapy  Discharge home with home health PT today pending bowel movement. Please remove provena negative pressure dressing on 02/07/2018 and apply honey comb dressing. Keep dressing clean and dry at all times.   Continue with compression stockings during the day and remove at nighttime. Follow-up with [facility] in 2 weeks.  DVT Prophylaxis - Lovenox, Foot Pumps and TED hose Weight-Bearing as tolerated to right leg   T. Cranston Neighborhris Stephanine Reas, PA-C [facility] 01/31/2018, 8:23 AM

## 2018-01-31 NOTE — Progress Notes (Signed)
Physical Therapy Treatment Patient Details Name: Roy Gonzalez MRN: [id number] DOB: Dec 21, 1965 Today's Date: 01/31/2018    History of Present Illness Pt is a 52 y.o. male s/p R THA anterior approach 01/29/18.  PMH includes HIV and avascular necrosis bone B hips.    PT Comments    Pt was awake upon PT arriving and motivated to walk and learn how to safely climb stairs.  Decreased cadence noted with ambulation; 6/10 R hip pain during session.  Pt ambulated 150ft; 167ft requiring vc's for step-to pattern, increased DF w/ heel strike, and increased WB through BUE to offload WB of RLE.  Able to navigate 4 stairs with B railings CGA.  Pt's HR increased with activity and required rest breaks; however Pt did not demonstrate SOB and overall asymptomatic. After second ambulation distance (of 148ft), Pt's HR was manually reported as 136 bpm.  See below vitals sections for further vitals during session. Therapist then provided chair to Pt and returned Pt to room; nursing was notified of increased HR w/ activity. Pt's HR was 120 bpm at end of session.   Follow Up Recommendations  Home health PT     Equipment Recommendations  Rolling walker with 5" wheels    Recommendations for Other Services       Precautions / Restrictions Precautions Precautions: Anterior Hip;Fall Precaution Booklet Issued: Yes (comment) Restrictions Weight Bearing Restrictions: Yes RLE Weight Bearing: Weight bearing as tolerated    Mobility  Bed Mobility Overal bed mobility: Modified Independent Bed Mobility: Supine to Sit     Supine to sit: Modified independent (Device/Increase time)     General bed mobility comments: increased effort and time for pt to perform, limited by pain.   Transfers Overall transfer level: Modified independent Equipment used: Rolling walker (2 wheeled) Transfers: Sit to/from Stand Sit to Stand: Modified independent (Device/Increase time)         General transfer comment:  increased  effort to stand with RW; CGA provided but not required  Ambulation/Gait Ambulation/Gait assistance: Min guard Ambulation Distance (Feet): (105;115) Assistive device: Rolling walker (2 wheeled) Gait Pattern/deviations: Step-to pattern Gait velocity: decreased   General Gait Details: decreased stance time R LE; vc's for technique and to increase UE support through RW; vc's for gait pattern and walker use (increase R heel strike)   Stairs Stairs: Yes  Assist: CGA Stair Management: Two rails;Step to pattern;Forwards Number of Stairs: 4 General stair comments: decreased stance time on R LE; vc's for technique to climb up leading with non-operative leg and go down leading with operative leg. Pt demonstrated good understanding. Pt required extra time and rest break after stair climbing due to fatigue and increased HR 131.  Wheelchair Mobility    Modified Rankin (Stroke Patients Only)       Balance Overall balance assessment: Needs assistance Sitting-balance support: No upper extremity supported;Feet supported Sitting balance-Leahy Scale: Good Sitting balance - Comments: steady sitting reaching within BOS   Standing balance support: No upper extremity supported Standing balance-Leahy Scale: Good Standing balance comment: steady standing reaching within BOS (no UE support)                            Cognition Arousal/Alertness: Awake/alert Behavior During Therapy: WFL for tasks assessed/performed Overall Cognitive Status: Within Functional Limits for tasks assessed  Exercises      General Comments General comments (skin integrity, edema, etc.): R hip wound vac in place      Pertinent Vitals/Pain Pain Assessment: 0-10 Pain Score: 6  Pain Location: R hip Pain Descriptors / Indicators: Sore Pain Intervention(s): Limited activity within patient's tolerance;Monitored during session;Premedicated before  session;Repositioned. Baseline HR 88bpm then ambulated w/ RW 145ft and took sitting rest break HR 123bpm; performed stair training on 4 steps and rest break HR 131bpm; then ambulated 115 ft w/ RW HR 136bpm; then provided chair to Pt and returned Pt to room; HR 120bpm end of session, nursing notified.    Home Living                      Prior Function            PT Goals (current goals can now be found in the care plan section) Acute Rehab PT Goals Patient Stated Goal: to go home PT Goal Formulation: With patient Time For Goal Achievement: 02/13/18 Potential to Achieve Goals: Good Progress towards PT goals: Progressing toward goals    Frequency    BID      PT Plan Current plan remains appropriate    Co-evaluation              AM-PAC PT "6 Clicks" Daily Activity  Outcome Measure  Difficulty turning over in bed (including adjusting bedclothes, sheets and blankets)?: A Little Difficulty moving from lying on back to sitting on the side of the bed? : A Little Difficulty sitting down on and standing up from a chair with arms (e.g., wheelchair, bedside commode, etc,.)?: A Little Help needed moving to and from a bed to chair (including a wheelchair)?: None Help needed walking in hospital room?: A Little Help needed climbing 3-5 steps with a railing? : A Little 6 Click Score: 19    End of Session Equipment Utilized During Treatment: Gait belt Activity Tolerance: Treatment limited secondary to medical complications (Comment)(Limited due to increased HR.) Patient left: in chair;with chair alarm set;with call bell/phone within reach;with SCD's reapplied(Nursing tech stated she would turn on SCD's after ECG.) Nurse Communication: Mobility status;Weight bearing status;Precautions(Pt increased HR with activity.) PT Visit Diagnosis: Other abnormalities of gait and mobility (R26.89);Muscle weakness (generalized) (M62.81);Difficulty in walking, not elsewhere classified  (R26.2);Pain Pain - Right/Left: Right Pain - part of body: Hip     Time: 1610-9604 PT Time Calculation (min) (ACUTE ONLY): 47 min  Charges:                       G Codes:       Suheyla Mortellaro Mondrian-Pardue SPT 01/31/2018, 12:43 PM

## 2018-01-31 NOTE — Care Management (Signed)
[facility] notified of patient discharge to home today. [facility] called this RNCM stating that they do not have his Rx card on file and cannot provide cost of Lovenox. I spoke with patient and [facility] is going to [facility]. He said he would call this RNCM back with any concerns.

## 2018-01-31 NOTE — Progress Notes (Signed)
MD Gouru notified results are back. Verbal orders received to D/C Heparin.

## 2018-01-31 NOTE — Progress Notes (Signed)
PT Cancellation Note  Patient Details Name: Roy BoozerMichael A Hedglin MRN: [id number] DOB: 12-23-1965   Cancelled Treatment:    Reason Eval/Treat Not Completed: Patient declined, no reason specified.  Pt gone in afternoon for testing (VQ scan negative).  PT attempted therapy session after testing and pt politely declining therapy d/t fatigue after afternoon activities/testing.  Encouraged pt to ambulate with nursing staff later today.  Will re-attempt PT treatment tomorrow morning.  Hendricks LimesEmily Zylen Wenig, PT 01/31/18, 5:10 PM [phone]

## 2018-01-31 NOTE — Discharge Summary (Signed)
Physician Discharge Summary  Patient ID: Roy Gonzalez MRN: [id number] DOB/AGE: Feb 04, 1966 52 y.o.  Admit date: 01/29/2018 Discharge date: 01/31/2018  Admission Diagnoses:  avascular necrosis of bone of right hip   Discharge Diagnoses: Patient Active Problem List   Diagnosis Date Noted  . Other osteonecrosis, right femur (HCC) 01/29/2018  . THRUSH 10/23/2006  . PNEUMOCYSTIS PNEUMONIA 10/23/2006  . 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 RENAL DISEASE 09/18/2003  . 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 Human immunodeficiency virus (HIV) disease (HCC) 03/19/1999  . HEPATITIS C 03/19/1999    Past Medical History:  Diagnosis Date  . Avascular necrosis of bones of both hips (HCC)   . HIV (human immunodeficiency virus infection) (HCC)      Transfusion: none   Consultants (if any):   Discharged Condition: Improved  Hospital Course: Roy Gonzalez is an 52 y.o. male who was admitted 01/29/2018 with a diagnosis of right hip avascular necrosis and went to the operating room on 01/29/2018 and underwent the above named procedures.    Surgeries: Procedure(s): TOTAL HIP ARTHROPLASTY ANTERIOR APPROACH on 01/29/2018 Patient tolerated the surgery well. Taken to PACU where she was stabilized and then transferred to the orthopedic floor.  Started on Lovenox 40 q 24 hrs. Foot pumps applied bilaterally at 80 mm. Heels elevated on bed with rolled towels. No evidence of DVT. Negative Homan. Physical therapy started on day #1 for gait training and transfer. OT started day #1 for ADL and assisted devices.  Patient's foley was d/c on day #1. Patient's IV and hemovac was d/c on day #1.   On post op day #2 patient was stable and ready for discharge to home with home health PT.  Implants: Medacta AMIS 4 standard stem with 54 mm Mpact cup DM with liner, XL 28 mm metal head    He was given perioperative antibiotics:  Anti-infectives (From admission, onward)   Start      Dose/Rate Route Frequency Ordered Stop   01/30/18 1000  abacavir-dolutegravir-lamiVUDine (TRIUMEQ) 600-50-300 MG per tablet 1 tablet     1 tablet Oral Daily 01/29/18 1619     01/29/18 1730  ceFAZolin (ANCEF) IVPB 2g/100 mL premix     2 g 200 mL/hr over 30 Minutes Intravenous Every 6 hours 01/29/18 1619 01/30/18 0631   01/29/18 0915  ceFAZolin (ANCEF) IVPB 2g/100 mL premix     2 g 200 mL/hr over 30 Minutes Intravenous  Once 01/29/18 0903 01/29/18 1205   01/29/18 0901  ceFAZolin (ANCEF) 2-4 GM/100ML-% IVPB    Comments:  Register, Karen   : cabinet override      01/29/18 0901 01/29/18 1135    .  He was given sequential compression devices, early ambulation, and Lovenox for DVT prophylaxis.  He benefited maximally from the hospital stay and there were no complications.    Recent vital signs:  Vitals:   01/31/18 0500 01/31/18 0744  BP: 133/72 124/66  Pulse: 89 88  Resp:  16  Temp: 98.3 F (36.8 C) 98.7 F (37.1 C)  SpO2:  100%    Recent laboratory studies:  Lab Results  Component Value Date   HGB 14.7 01/31/2018   HGB 14.9 01/29/2018   HGB 16.1 01/16/2018   Lab Results  Component Value Date   WBC 7.0 01/31/2018   PLT 123 (L) 01/31/2018   Lab Results  Component Value Date   INR 0.90 01/16/2018   Lab Results  Component Value Date   NA 140 01/31/2018  K 4.0 01/31/2018   CL 105 01/31/2018   CO2 24 01/31/2018   BUN 14 01/31/2018   CREATININE 1.48 (H) 01/31/2018   GLUCOSE 100 (H) 01/31/2018    Discharge Medications:   Allergies as of 01/31/2018   No Known Allergies     Medication List    STOP taking these medications   naproxen sodium 220 MG tablet Commonly known as:  ALEVE     TAKE these medications   enoxaparin 40 MG/0.4ML injection Commonly known as:  LOVENOX Inject 0.4 mLs (40 mg total) into the skin daily for 14 days. Start taking on:  02/01/2018   oxyCODONE 5 MG immediate release tablet Commonly known as:  Oxy IR/ROXICODONE Take 1-2 tablets  (5-10 mg total) by mouth every 4 (four) hours as needed for moderate pain ((score 4 to 6)).   TRIUMEQ 600-50-300 MG tablet Generic drug:  abacavir-dolutegravir-lamiVUDine Take 1 tablet by mouth daily.       Diagnostic Studies: Dg Hip Operative Unilat W Or W/o Pelvis Right  Result Date: 01/29/2018 CLINICAL DATA:  Intraoperative imaging for right hip replacement. EXAM: OPERATIVE RIGHT HIP (WITH PELVIS IF PERFORMED) 5 VIEWS TECHNIQUE: Fluoroscopic spot image(s) were submitted for interpretation post-operatively. COMPARISON:  None. FINDINGS: Five fluoroscopic intraoperative spot views of the right hip demonstrate placement of a right hip arthroplasty. The device is located. No fracture or other acute abnormality is identified. Tip of the femoral stem is not included on the exam. IMPRESSION: Intraoperative imaging for right hip replacement.  No acute finding. Electronically Signed   By: Drusilla Kanner M.D.   On: 01/29/2018 13:01   Dg Hip Unilat W Or W/o Pelvis 2-3 Views Right  Result Date: 01/29/2018 CLINICAL DATA:  52 year old male with a history of right hip arthroplasty EXAM: DG HIP (WITH OR WITHOUT PELVIS) 2-3V RIGHT COMPARISON:  None. FINDINGS: Two views of the right hip demonstrate surgical changes of right hip arthroplasty. Surgical drains within the soft tissues with gas projecting at the surgical site. Surgical clips within the overlying soft tissues. Components appear congruent.  No acute fracture. IMPRESSION: Early surgical changes of right hip arthroplasty without complicating features. Electronically Signed   By: Gilmer Mor D.O.   On: 01/29/2018 14:12    Disposition: 01-Home or Self Care    Follow-up Information    Kennedy Bucker, MD Follow up in 2 week(s).   Specialty:  Orthopedic Surgery Contact information: [address] [place] [address] [phone]            Signed: Amador Cunas [facility] 01/31/2018, 8:32 AM

## 2018-01-31 NOTE — Progress Notes (Signed)
MD Gouru paged about heparin order, Per MD ok to start heparin infusion. Infusion started.

## 2018-01-31 NOTE — Discharge Instructions (Signed)
ANTERIOR APPROACH TOTAL HIP REPLACEMENT POSTOPERATIVE DIRECTIONS   Hip Rehabilitation, Guidelines Following Surgery  The results of a hip operation are greatly improved after range of motion and muscle strengthening exercises. Follow all safety measures which are given to protect your hip. If any of these exercises cause increased pain or swelling in your joint, decrease the amount until you are comfortable again. Then slowly increase the exercises. Call your caregiver if you have problems or questions.   HOME CARE INSTRUCTIONS  Remove items at home which could result in a fall. This includes throw rugs or furniture in walking pathways.   ICE to the affected hip every three hours for 30 minutes at a time and then as needed for pain and swelling.  Continue to use ice on the hip for pain and swelling from surgery. You may notice swelling that will progress down to the foot and ankle.  This is normal after surgery.  Elevate the leg when you are not up walking on it.    Continue to use the breathing machine which will help keep your temperature down.  It is common for your temperature to cycle up and down following surgery, especially at night when you are not up moving around and exerting yourself.  The breathing machine keeps your lungs expanded and your temperature down.  Do not place pillow under knee, focus on keeping the knee straight while resting  DIET You may resume your previous home diet once your are discharged from the hospital.  DRESSING / WOUND CARE / SHOWERING Please remove provena negative pressure dressing on 02/07/2018 and apply honey comb dressing. Keep dressing clean and dry at all times.   ACTIVITY Walk with your walker as instructed. Use walker as long as suggested by your caregivers. Avoid periods of inactivity such as sitting longer than an hour when not asleep. This helps prevent blood clots.  You may resume a sexual relationship in one month or when given the OK by  your doctor.  You may return to work once you are cleared by your doctor.  Do not drive a car for 6 weeks or until released by you surgeon.  Do not drive while taking narcotics.  WEIGHT BEARING Weight bearing as tolerated. Use walker/cane as needed for at least 4 weeks post op.  POSTOPERATIVE CONSTIPATION PROTOCOL Constipation - defined medically as fewer than three stools per week and severe constipation as less than one stool per week.  One of the most common issues patients have following surgery is constipation.  Even if you have a regular bowel pattern at home, your normal regimen is likely to be disrupted due to multiple reasons following surgery.  Combination of anesthesia, postoperative narcotics, change in appetite and fluid intake all can affect your bowels.  In order to avoid complications following surgery, here are some recommendations in order to help you during your recovery period.  Colace (docusate) - Pick up an over-the-counter form of Colace or another stool softener and take twice a day as long as you are requiring postoperative pain medications.  Take with a full glass of water daily.  If you experience loose stools or diarrhea, hold the colace until you stool forms back up.  If your symptoms do not get better within 1 week or if they get worse, check with your doctor.  Dulcolax (bisacodyl) - Pick up over-the-counter and take as directed by the product packaging as needed to assist with the movement of your bowels.  Take with a  full glass of water.  Use this product as needed if not relieved by Colace only.  ° °MiraLax (polyethylene glycol) - Pick up over-the-counter to have on hand.  MiraLax is a solution that will increase the amount of water in your bowels to assist with bowel movements.  Take as directed and can mix with a glass of water, juice, soda, coffee, or tea.  Take if you go more than two days without a movement. °Do not use MiraLax more than once per day. Call your  doctor if you are still constipated or irregular after using this medication for 7 days in a row. ° °If you continue to have problems with postoperative constipation, please contact the office for further assistance and recommendations.  If you experience "the worst abdominal pain ever" or develop nausea or vomiting, please contact the office immediatly for further recommendations for treatment. ° °ITCHING ° If you experience itching with your medications, try taking only a single pain pill, or even half a pain pill at a time.  You can also use Benadryl over the counter for itching or also to help with sleep.  ° °TED HOSE STOCKINGS °Wear the elastic stockings on both legs for six weeks following surgery during the day but you may remove then at night for sleeping. ° °MEDICATIONS °See your medication summary on the “After Visit Summary” that the nursing staff will review with you prior to discharge.  You may have some home medications which will be placed on hold until you complete the course of blood thinner medication.  It is important for you to complete the blood thinner medication as prescribed by your surgeon.  Continue your approved medications as instructed at time of discharge. ° °PRECAUTIONS °If you experience chest pain or shortness of breath - call 911 immediately for transfer to the hospital emergency department.  °If you develop a fever greater that 101 F, purulent drainage from wound, increased redness or drainage from wound, foul odor from the wound/dressing, or calf pain - CONTACT YOUR SURGEON.   °                                                °FOLLOW-UP APPOINTMENTS °Make sure you keep all of your appointments after your operation with your surgeon and caregivers. You should call the office at the above phone number and make an appointment for approximately two weeks after the date of your surgery or on the date instructed by your surgeon outlined in the "After Visit Summary". ° °RANGE OF MOTION  AND STRENGTHENING EXERCISES  °These exercises are designed to help you keep full movement of your hip joint. Follow your caregiver's or physical therapist's instructions. Perform all exercises about fifteen times, three times per day or as directed. Exercise both hips, even if you have had only one joint replacement. These exercises can be done on a training (exercise) mat, on the floor, on a table or on a bed. Use whatever works the best and is most comfortable for you. Use music or television while you are exercising so that the exercises are a pleasant break in your day. This will make your life better with the exercises acting as a break in routine you can look forward to.  °Lying on your back, slowly slide your foot toward your buttocks, raising your knee up off the floor. Then   slowly slide your foot back down until your leg is straight again.  °Lying on your back spread your legs as far apart as you can without causing discomfort.  °Lying on your side, raise your upper leg and foot straight up from the floor as far as is comfortable. Slowly lower the leg and repeat.  °Lying on your back, tighten up the muscle in the front of your thigh (quadriceps muscles). You can do this by keeping your leg straight and trying to raise your heel off the floor. This helps strengthen the largest muscle supporting your knee.  °Lying on your back, tighten up the muscles of your buttocks both with the legs straight and with the knee bent at a comfortable angle while keeping your heel on the floor.  ° °IF YOU ARE TRANSFERRED TO A SKILLED REHAB FACILITY °If the patient is transferred to a skilled rehab facility following release from the hospital, a list of the current medications will be sent to the facility for the patient to continue.  When discharged from the skilled rehab facility, please have the facility set up the patient's Home Health Physical Therapy prior to being released. Also, the skilled facility will be responsible  for providing the patient with their medications at time of release from the facility to include their pain medication, the muscle relaxants, and their blood thinner medication. If the patient is still at the rehab facility at time of the two week follow up appointment, the skilled rehab facility will also need to assist the patient in arranging follow up appointment in our office and any transportation needs. ° °MAKE SURE YOU:  °Understand these instructions.  °Get help right away if you are not doing well or get worse.  ° ° °Pick up stool softner and laxative for home use following surgery while on pain medications. °Continue to use ice for pain and swelling after surgery. °Do not use any lotions or creams on the incision until instructed by your surgeon. ° °

## 2018-01-31 NOTE — Progress Notes (Signed)
Nurse notified by PT that pts HR is in the 130's while ambulating. [place] Roy Gonzalez notified. Orders received for EKG and Hospitalist consult. EKG order placed, [place] [facility] Roy Gonzalez notifying hospitalist. HR now 110-113's

## 2018-01-31 NOTE — Consult Note (Signed)
ANTICOAGULATION CONSULT NOTE - Initial Consult  Pharmacy Consult for Heparin Drip Dosing and Monitoring   Indication: pulmonary embolus  No Known Allergies  Patient Measurements: Height: 5\' 6"  (167.6 cm) Weight: 195 lb (88.5 kg) IBW/kg (Calculated) : 63.8 Heparin Dosing Weight: 82kg  Vital Signs: Temp: 98.7 F (37.1 C) (02/14 0744) Temp Source: Oral (02/14 0744) BP: 146/69 (02/14 1105) Pulse Rate: 113 (02/14 1105)  Labs: Recent Labs    01/29/18 1640 01/31/18 0410  HGB 14.9 14.7  HCT 44.3 43.7  PLT 164 123*  CREATININE 1.66* 1.48*    Estimated Creatinine Clearance: 60.2 mL/min (A) (by C-G formula based on SCr of 1.48 mg/dL (H)).   Medical History: Past Medical History:  Diagnosis Date  . Avascular necrosis of bones of both hips (HCC)   . HIV (human immunodeficiency virus infection) (HCC)    Assessment: Pharmacy consulted for heparin dosing and monitoring in 52 yo patient with possible PE. S/P Total Hip Arthroplasty on 2/12.   Patient was receiving enoxaparin 40mg  daily. Last dose was this morning 2/14 @0745 . Enoxaparin has now been discontinued   Goal of Therapy:  Heparin level 0.3-0.7 units/ml aPTT 66-102 seconds Monitor platelets by anticoagulation protocol: Yes   Plan:  Baseline labs ordered Heparin DW: 82kg Give 5000 units bolus x 1 Start heparin infusion at 1400 units/hr Check anti-Xa level in 6 hours and daily while on heparin Continue to monitor H&H and platelets  Gardner CandleSheema M Hillard Goodwine, PharmD, BCPS Clinical Pharmacist 01/31/2018 12:32 PM

## 2018-01-31 NOTE — Consult Note (Signed)
Reason for Consult: No chief complaint on file.  Referring Physician: Dr. Mena Goes is an 52 y.o. male.  HPI: Roy Gonzalez is a 52 year old male admitted to orthopedic service for right hip avascular necrosis and had a total hip arthroplasty.  Patient is found to be tachycardic and hypoxemic today.  Hospitalist team is consulted.  Patient denies any chest pain or shortness of breath.  Resting comfortably during my examination.  Denies any tightness in his chest.  Past Medical History:  Diagnosis Date  . Avascular necrosis of bones of both hips ([place])   . HIV (human immunodeficiency virus infection) ([place])     Past Surgical History:  Procedure Laterality Date  . FACIAL LACERATION REPAIR Right    age 50 from [place]  . TOTAL HIP ARTHROPLASTY Left 2002  . TOTAL HIP ARTHROPLASTY Right 01/29/2018   Procedure: TOTAL HIP ARTHROPLASTY ANTERIOR APPROACH;  Surgeon: Hessie Knows, MD;  Location: [facility] ORS;  Service: Orthopedics;  Laterality: Right;    Family History  Problem Relation Age of Onset  . Diabetes Mother     Social History:  reports that he has been smoking cigarettes.  He has been smoking about 0.50 packs per day. he has never used smokeless tobacco. He reports that he drinks alcohol. He reports that he does not use drugs.  Allergies: No Known Allergies  Medications: I have reviewed the patient's current medications.  Results for orders placed or performed during the hospital encounter of 01/29/18 (from the past 48 hour(s))  CBC     Status: Abnormal   Collection Time: 01/31/18  4:10 AM  Result Value Ref Range   WBC 7.0 3.8 - 10.6 K/uL   RBC 4.06 (L) 4.40 - 5.90 MIL/uL   Hemoglobin 14.7 13.0 - 18.0 g/dL   HCT 43.7 40.0 - 52.0 %   MCV 107.7 (H) 80.0 - 100.0 fL   MCH 36.2 (H) 26.0 - 34.0 pg   MCHC 33.6 32.0 - 36.0 g/dL   RDW 13.1 11.5 - 14.5 %   Platelets 123 (L) 150 - 440 K/uL    Comment: Performed at [facility], [address] [place], [place] [address]   Basic metabolic panel     Status: Abnormal   Collection Time: 01/31/18  4:10 AM  Result Value Ref Range   Sodium 140 135 - 145 mmol/L   Potassium 4.0 3.5 - 5.1 mmol/L   Chloride 105 101 - 111 mmol/L   CO2 24 22 - 32 mmol/L   Glucose, Bld 100 (H) 65 - 99 mg/dL   BUN 14 6 - 20 mg/dL   Creatinine, Ser 1.48 (H) 0.61 - 1.24 mg/dL   Calcium 9.2 8.9 - 10.3 mg/dL   GFR calc non Af Amer 52 (L) >60 mL/min   GFR calc Af Amer >60 >60 mL/min    Comment: (NOTE) The eGFR has been calculated using the CKD EPI equation. This calculation has not been validated in all clinical situations. eGFR's persistently <60 mL/min signify possible Chronic Kidney Disease.    Anion gap 11 5 - 15    Comment: Performed at [facility], [address] [place], [place] [address]  APTT     Status: None   Collection Time: 01/31/18 12:25 PM  Result Value Ref Range   aPTT 35 24 - 36 seconds    Comment: Performed at [facility], [address] [place], [place] [address]  Protime-INR     Status: None   Collection  Time: 01/31/18 12:25 PM  Result Value Ref Range   Prothrombin Time 13.2 11.4 - 15.2 seconds   INR 1.01     Comment: Performed at [facility], [place], [place] [address]  Heparin level (unfractionated)     Status: Abnormal   Collection Time: 01/31/18 12:25 PM  Result Value Ref Range   Heparin Unfractionated 0.22 (L) 0.30 - 0.70 IU/mL    Comment:        IF HEPARIN RESULTS ARE BELOW EXPECTED VALUES, AND PATIENT DOSAGE HAS BEEN CONFIRMED, SUGGEST FOLLOW UP TESTING OF ANTITHROMBIN III LEVELS. Performed at [facility], [address] [place], [place] [address]     Dg Chest 2 View  Result Date: 01/31/2018 CLINICAL DATA:  Recent hip replacement with tachycardia EXAM: CHEST  2 VIEW COMPARISON:  03/18/2016 FINDINGS: The heart size and mediastinal contours are within normal limits. Both lungs are clear. The visualized skeletal structures are  unremarkable. IMPRESSION: No active cardiopulmonary disease. Electronically Signed   By: Inez Catalina M.D.   On: 01/31/2018 15:16   Nm Pulmonary Perf And Vent  Result Date: 01/31/2018 CLINICAL DATA:  Shortness of Breath following recent hip surge EXAM: NUCLEAR MEDICINE VENTILATION - PERFUSION LUNG SCAN TECHNIQUE: Ventilation images were obtained in multiple projections using inhaled aerosol Tc-36mDTPA. Perfusion images were obtained in multiple projections after intravenous injection of Tc-916mAA. RADIOPHARMACEUTICALS:  31.44 mCi of Tc-9934mPA aerosol inhalation and 3.98 mCi Tc99m59m IV COMPARISON:  None. FINDINGS: Ventilation: Ventilation images demonstrate no large ventilation defect. Some mild trapping is noted centrally. Perfusion: Perfusion images demonstrate no definitive fusion defect to suggest pulmonary embolism. IMPRESSION: No definitive ventilation perfusion defect is noted to suggest pulmonary embolism. Electronically Signed   By: MarkInez Catalina.   On: 01/31/2018 15:04    ROS:  CONSTITUTIONAL: Denies fevers, chills. Denies any fatigue, weakness.  EYES: Denies blurry vision, double vision, eye pain. EARS, NOSE, THROAT: Denies tinnitus, ear pain, hearing loss. RESPIRATORY: Denies cough, wheeze, shortness of breath.  CARDIOVASCULAR: Denies chest pain, palpitations, edema.  GASTROINTESTINAL: Denies nausea, vomiting, diarrhea, abdominal pain. Denies bright red blood per rectum. GENITOURINARY: Denies dysuria, hematuria. ENDOCRINE: Denies nocturia or thyroid problems. HEMATOLOGIC AND LYMPHATIC: Denies easy bruising or bleeding. SKIN: Denies rash or lesion. MUSCULOSKELETAL: Right hip surgery  NEUROLOGIC: Denies paralysis, paresthesias.  PSYCHIATRIC: Denies anxiety or depressive symptoms. Blood pressure (!) 141/61, pulse (!) 111, temperature 98 F (36.7 C), temperature source Oral, resp. rate 16, height '5\' 6"'$  (1.676 m), weight 88.5 kg (195 lb), SpO2 98 %.   PHYSICAL EXAMINATION:   GENERAL: Well-nourished, well-developed currently in no acute distress.  HEAD: Normocephalic, atraumatic.  EYES: Pupils equal, round, and reactive to light. Extraocular muscles intact. No scleral icterus.  MOUTH: Moist mucosal membranes. Dentition intact. No abscess noted. EARS, NOSE, THROAT: Clear without exudates. No external lesions.  NECK: Supple. No thyromegaly. No nodules. No JVD.  PULMONARY: Clear to auscultation bilaterally without wheezes, rales, or rhonchi. No use of accessory muscles. Good respiratory effort. CHEST: Nontender to palpation.  CARDIOVASCULAR: S1, S2, regular rate and rhythm. No murmurs, rubs, or gallops.  GASTROINTESTINAL: Soft, nontender, nondistended. No masses. Positive bowel sounds. No hepatosplenomegaly. MUSCULOSKELETAL: Right hip status post surgery and clean dressing.  No swelling, clubbing, edema.  NEUROLOGIC: Cranial nerves II-XII intact. No gross focal neurological deficits. Sensation intact. Reflexes intact. SKIN: No ulcerations, lesions, rash, cyanosis. Skin warm, dry. Turgor intact. PSYCHIATRIC: Mood, affect within normal limits. Patient awake, alert, oriented x 3. Insight and  judgment intact.   Assessment/Plan:   #Sinus tachycardia with hypoxia postoperatively Hospitalist team is consulted Concerning for pulmonary embolism VQ scan is done which is negative Patient was briefly started on heparin drip without bolus as there is a concern for pulmonary embolism which is discontinued after  VQ scan which is being negative Recommend incentive spirometry Patient needs deconditioning hydration with IV fluids  #HIV Continue antiretrovirals and follow-up with Dr. Ola Spurr as recommended  #Acute kidney injury with dehydration Hydrate with IV fluids Check renal function in a.m. Avoid nephrotoxins  #Right total hip arthroplasty for a vascular necrosis Pain management and follow-up per orthopedics  Thank you Dr. Rudene Christians for allowing hospitalist team to  take care of this patient TOTAL TIME TAKING CARE OF THIS PATIENT: 42 minutes.   Note: This dictation was prepared with Dragon dictation along with smaller phrase technology. Any transcriptional errors that result from this process are unintentional.   '@MEC'$ @ Pager - [phone] 01/31/2018, 5:28 PM

## 2018-01-31 NOTE — Progress Notes (Signed)
Family Meeting Note  Advance Directive:yes  Today a meeting took place with the Patient.    The following clinical team members were present during this meeting:MD  The following were discussed:Patient's diagnosis: Hip surgery for vascular necrosis of the right hip, chronic history of HIV, acute kidney injury patient's progosis: > 12 months and Goals for treatment: Full Code.  Brother Nadine CountsBob is the healthcare power of attorney  Additional follow-up to be provided: Orthopedics and hospitalist  Time spent during discussion:17 min  Roy LabAruna Gearldine Looney, MD

## 2018-02-01 LAB — CBC
HCT: 39.4 % — ABNORMAL LOW (ref 40.0–52.0)
HEMOGLOBIN: 13.5 g/dL (ref 13.0–18.0)
MCH: 36.1 pg — AB (ref 26.0–34.0)
MCHC: 34.2 g/dL (ref 32.0–36.0)
MCV: 105.6 fL — ABNORMAL HIGH (ref 80.0–100.0)
Platelets: 119 10*3/uL — ABNORMAL LOW (ref 150–440)
RBC: 3.73 MIL/uL — ABNORMAL LOW (ref 4.40–5.90)
RDW: 13 % (ref 11.5–14.5)
WBC: 6.5 10*3/uL (ref 3.8–10.6)

## 2018-02-01 LAB — BASIC METABOLIC PANEL
ANION GAP: 10 (ref 5–15)
BUN: 18 mg/dL (ref 6–20)
CALCIUM: 8.8 mg/dL — AB (ref 8.9–10.3)
CO2: 23 mmol/L (ref 22–32)
CREATININE: 1.41 mg/dL — AB (ref 0.61–1.24)
Chloride: 104 mmol/L (ref 101–111)
GFR calc Af Amer: >60 mL/min (ref >60)
GFR calc non Af Amer: 55 mL/min — ABNORMAL LOW (ref >60)
GLUCOSE: 103 mg/dL — AB (ref 65–99)
Potassium: 3.9 mmol/L (ref 3.5–5.1)
Sodium: 137 mmol/L (ref 135–145)

## 2018-02-01 MED ORDER — ENOXAPARIN SODIUM 40 MG/0.4ML ~~LOC~~ SOLN: 40.0000 mg | SUBCUTANEOUS | Status: DC

## 2018-02-01 NOTE — Progress Notes (Signed)
Called [place] NeighborChris Gaines to advise patient elevated HR during PT. Dr. Imogene Burnhen came in to clear patient for discharge.

## 2018-02-01 NOTE — Progress Notes (Addendum)
[employer] - [place] at [facility]   PATIENT NAME: Roy Gonzalez    MR#:  [id number]  DATE OF BIRTH:  02-27-1966  SUBJECTIVE:  CHIEF COMPLAINT:  No chief complaint on file.  No complaint. REVIEW OF SYSTEMS:  Review of Systems  Constitutional: Negative for chills, fever and malaise/fatigue.  HENT: Negative for sore throat.   Eyes: Negative for blurred vision and double vision.  Respiratory: Negative for cough, hemoptysis, shortness of breath, wheezing and stridor.   Cardiovascular: Negative for chest pain, palpitations, orthopnea and leg swelling.  Gastrointestinal: Negative for abdominal pain, blood in stool, diarrhea, melena, nausea and vomiting.  Genitourinary: Negative for dysuria, flank pain and hematuria.  Musculoskeletal: Negative for back pain and joint pain.  Neurological: Negative for dizziness, sensory change, focal weakness, seizures, loss of consciousness, weakness and headaches.  Endo/Heme/Allergies: Negative for polydipsia.  Psychiatric/Behavioral: Negative for depression. The patient is not nervous/anxious.     DRUG ALLERGIES:  No Known Allergies VITALS:  Blood pressure 124/80, pulse (!) 112, temperature 97.9 F (36.6 C), temperature source Oral, resp. rate 18, height 5\' 6"  (1.676 m), weight 195 lb (88.5 kg), SpO2 97 %. PHYSICAL EXAMINATION:  Physical Exam  Constitutional: He is oriented to person, place, and time and well-developed, well-nourished, and in no distress.  HENT:  Head: Normocephalic.  Mouth/Throat: Oropharynx is clear and moist.  Eyes: Conjunctivae and EOM are normal. Pupils are equal, round, and reactive to light. No scleral icterus.  Neck: Normal range of motion. Neck supple. No JVD present. No tracheal deviation present.  Cardiovascular: Normal rate, regular rhythm and normal heart sounds. Exam reveals no gallop.  No murmur heard. Pulmonary/Chest: Effort normal and breath sounds normal. No respiratory distress. He has no  wheezes. He has no rales.  Abdominal: Soft. Bowel sounds are normal. He exhibits no distension. There is no tenderness. There is no rebound.  Musculoskeletal: Normal range of motion. He exhibits no edema or tenderness.  Neurological: He is alert and oriented to person, place, and time. No cranial nerve deficit.  Skin: No rash noted. No erythema.  Psychiatric: Affect normal.   LABORATORY PANEL:  Male CBC Recent Labs  Lab 02/01/18 0409  WBC 6.5  HGB 13.5  HCT 39.4*  PLT 119*   ------------------------------------------------------------------------------------------------------------------ Chemistries  Recent Labs  Lab 02/01/18 0409  NA 137  K 3.9  CL 104  CO2 23  GLUCOSE 103*  BUN 18  CREATININE 1.41*  CALCIUM 8.8*   RADIOLOGY:  Dg Chest 2 View  Result Date: 01/31/2018 CLINICAL DATA:  Recent hip replacement with tachycardia EXAM: CHEST  2 VIEW COMPARISON:  03/18/2016 FINDINGS: The heart size and mediastinal contours are within normal limits. Both lungs are clear. The visualized skeletal structures are unremarkable. IMPRESSION: No active cardiopulmonary disease. Electronically Signed   By: Alcide CleverMark  Lukens M.D.   On: 01/31/2018 15:16   Nm Pulmonary Perf And Vent  Result Date: 01/31/2018 CLINICAL DATA:  Shortness of Breath following recent hip surge EXAM: NUCLEAR MEDICINE VENTILATION - PERFUSION LUNG SCAN TECHNIQUE: Ventilation images were obtained in multiple projections using inhaled aerosol Tc-6483m DTPA. Perfusion images were obtained in multiple projections after intravenous injection of Tc-6783m-MAA. RADIOPHARMACEUTICALS:  31.44 mCi of Tc-7183m DTPA aerosol inhalation and 3.98 mCi Tc4583m-MAA IV COMPARISON:  None. FINDINGS: Ventilation: Ventilation images demonstrate no large ventilation defect. Some mild trapping is noted centrally. Perfusion: Perfusion images demonstrate no definitive fusion defect to suggest pulmonary embolism. IMPRESSION: No definitive ventilation perfusion defect  is noted to suggest  pulmonary embolism. Electronically Signed   By: Alcide Clever M.D.   On: 01/31/2018 15:04   ASSESSMENT AND PLAN:    #Sinus tachycardia with hypoxia postoperatively No pulmonary embolism VQ scan is done which is negative Patient was briefly started on heparin drip without bolus as there is a concern for pulmonary embolism which is discontinued after  VQ scan which is being negative Recommend incentive spirometry  #HIV Continue antiretrovirals and follow-up with Dr. Sampson Goon as recommended  #Acute kidney injury with dehydration Improved with IV fluids  #Right total hip arthroplasty for a vascular necrosis Pain management and follow-up per orthopedics Tobacco abuse. Smoking cessation was counseled for 3-4 minutes. He is trying to quit.  Medically stable. All the records are reviewed and case discussed with Care Management/Social Worker. Management plans discussed with the patient, family and they are in agreement.  CODE STATUS: Full Code  TOTAL TIME TAKING CARE OF THIS PATIENT: 18 minutes.   More than 50% of the time was spent in counseling/coordination of care: YES  POSSIBLE D/C TODAY, DEPENDING ON CLINICAL CONDITION.   Shaune Pollack M.D on 02/01/2018 at 11:18 AM  Between 7am to 6pm - Pager - [phone]  After 6pm go to [email or web address] - Therapist, nutritional Hospitalists

## 2018-02-01 NOTE — Progress Notes (Signed)
Patient is being discharged to home today. DC & RX instructions given and patient had no questions. IV removed, NT to help patient prepare for transport.

## 2018-02-01 NOTE — Progress Notes (Signed)
   Subjective: 3 Days Post-Op Procedure(s) (LRB): TOTAL HIP ARTHROPLASTY ANTERIOR APPROACH (Right) Patient reports pain as mild.   Patient is well, and has had no acute complaints or problems.  Tachycardic and hypoxic yesterday after PT, workup for PE negative.  Patient doing well this morning oxygen saturations and heart rate improved.  Denies any symptoms. Denies any CP, SOB, ABD pain. We will continue therapy today.  Plan is to go home after hospital stay  Objective: Vital signs in last 24 hours: Temp:  [97.9 F (36.6 C)-99.4 F (37.4 C)] 97.9 F (36.6 C) (02/15 0750) Pulse Rate:  [94-136] 94 (02/15 0750) Resp:  [16-18] 18 (02/15 0750) BP: (114-146)/(61-80) 124/80 (02/15 0750) SpO2:  [95 %-98 %] 97 % (02/15 0750)  Intake/Output from previous day: 02/14 0701 - 02/15 0700 In: 720 [P.O.:720] Out: -  Intake/Output this shift: Total I/O In: -  Out: 200 [Urine:200]  Recent Labs    01/29/18 1640 01/31/18 0410 02/01/18 0409  HGB 14.9 14.7 13.5   Recent Labs    01/31/18 0410 02/01/18 0409  WBC 7.0 6.5  RBC 4.06* 3.73*  HCT 43.7 39.4*  PLT 123* 119*   Recent Labs    01/31/18 0410 02/01/18 0409  NA 140 137  K 4.0 3.9  CL 105 104  CO2 24 23  BUN 14 18  CREATININE 1.48* 1.41*  GLUCOSE 100* 103*  CALCIUM 9.2 8.8*   Recent Labs    01/31/18 1225  INR 1.01    EXAM General - Patient is Alert, Appropriate and Oriented Extremity - Neurovascular intact Sensation intact distally Intact pulses distally Dorsiflexion/Plantar flexion intact No cellulitis present Compartment soft Dressing - dressing C/D/I and no drainage,  wound vac intact without any drainage Motor Function - intact, moving foot and toes well on exam.   Past Medical History:  Diagnosis Date  . Avascular necrosis of bones of both hips ([facility])   . HIV (human immunodeficiency virus infection) ([facility])     Assessment/Plan:   3 Days Post-Op Procedure(s) (LRB): TOTAL HIP ARTHROPLASTY ANTERIOR  APPROACH (Right) Active Problems:   Other osteonecrosis, right femur ([facility])  Estimated body mass index is 31.47 kg/m as calculated from the following:   Height as of this encounter: 5\' 6"  (1.676 m).   Weight as of this encounter: 88.5 kg (195 lb). Advance diet Up with therapy  Discharge home with home health PT today pending her progress with PT Please remove provena negative pressure dressing on 02/08/2018 and apply honey comb dressing. Keep dressing clean and dry at all times.   Continue with compression stockings during the day and remove at nighttime. Follow-up with [facility] in 2 weeks.  DVT Prophylaxis - Lovenox, Foot Pumps and TED hose Weight-Bearing as tolerated to right leg   T. [place] Neighborhris Gaines, PA-C [facility] 02/01/2018, 8:02 AM

## 2018-02-01 NOTE — Progress Notes (Signed)
Physical Therapy Treatment Patient Details Name: Roy Gonzalez MRN: [id number] DOB: Aug 28, 1966 Today's Date: 02/01/2018    History of Present Illness Pt is a 52 y.o. male s/p R THA anterior approach 01/29/18.  PMH includes HIV and avascular necrosis bone B hips.    PT Comments    Pt presented awake in bed and asymptomatic; Pt was motivated to ambulate. Pt HR in supine at rest was 106 bpm / 02 96% on room air. Pt transferred from supine to sit HR 123 bpm / O2 97%. Pt then ambulated 27' with RW, HR increased to 136 bpm / O2 97%. After a standing rest break, Pt ambulated back to room, another 62', HR 140 bpm / O2 98%. Pt demonstrated good gait pattern and remained asymptomatic throughout session. Pt was repositioned into chair; end of session HR was 112 bpm / O2 97% at rest.  Nurse was notified of mobility and HR.     Follow Up Recommendations  Home health PT     Equipment Recommendations  Rolling walker with 5" wheels    Recommendations for Other Services       Precautions / Restrictions Precautions Precautions: Anterior Hip;Fall Precaution Booklet Issued: Yes (comment) Restrictions Weight Bearing Restrictions: Yes RLE Weight Bearing: Weight bearing as tolerated    Mobility  Bed Mobility Overal bed mobility: Modified Independent Bed Mobility: Supine to Sit     Supine to sit: Modified independent (Device/Increase time)     General bed mobility comments: increased effort and time for pt to perform, limited by pain.   Transfers Overall transfer level: Modified independent Equipment used: Rolling walker (2 wheeled) Transfers: Sit to/from Stand Sit to Stand: Modified independent (Device/Increase time)         General transfer comment:  increased effort to stand with RW; CGA provided but not required  Ambulation/Gait Ambulation/Gait assistance: Min guard(CGA provided but not required)   Assistive device: Rolling walker (2 wheeled) Gait Pattern/deviations: Step-to  pattern Gait velocity: decreased   General Gait Details: decreased stance time R LE; initial vc's for R DF and heel strike, but no further cueing required.   Stairs Stairs: (Pt declined, reports no questions or concerns performing stairs at home)          Wheelchair Mobility    Modified Rankin (Stroke Patients Only)       Balance Overall balance assessment: Needs assistance Sitting-balance support: No upper extremity supported;Feet supported Sitting balance-Leahy Scale: Good Sitting balance - Comments: steady sitting reaching within BOS   Standing balance support: No upper extremity supported   Standing balance comment: steady standing reaching within BOS (no UE support)                            Cognition Arousal/Alertness: Awake/alert Behavior During Therapy: WFL for tasks assessed/performed Overall Cognitive Status: Within Functional Limits for tasks assessed                                        Exercises Total Joint Exercises Long Arc Quad: AROM;Strengthening;Both;10 reps;Seated General Exercises - Lower Extremity Hip Flexion/Marching (seated): AROM;Left;AAROM;Right;Strengthening;10 reps;Seated    General Comments General comments (skin integrity, edema, etc.): R hip wound vac in place.  Pt agreeable to PT session.      Pertinent Vitals/Pain Pain Assessment: 0-10 Pain Score: 4  Pain Location: R hip Pain Descriptors / Indicators:  Sore Pain Intervention(s): Limited activity within patient's tolerance;Monitored during session;Premedicated before session;Repositioned    Home Living                      Prior Function            PT Goals (current goals can now be found in the care plan section) Acute Rehab PT Goals Patient Stated Goal: to go home PT Goal Formulation: With patient Time For Goal Achievement: 02/13/18 Potential to Achieve Goals: Good Progress towards PT goals: Progressing toward goals     Frequency    BID      PT Plan Current plan remains appropriate    Co-evaluation              AM-PAC PT "6 Clicks" Daily Activity  Outcome Measure  Difficulty turning over in bed (including adjusting bedclothes, sheets and blankets)?: A Little Difficulty moving from lying on back to sitting on the side of the bed? : A Little Difficulty sitting down on and standing up from a chair with arms (e.g., wheelchair, bedside commode, etc,.)?: A Little Help needed moving to and from a bed to chair (including a wheelchair)?: None Help needed walking in hospital room?: A Little Help needed climbing 3-5 steps with a railing? : A Little 6 Click Score: 19    End of Session Equipment Utilized During Treatment: Gait belt Activity Tolerance: Treatment limited secondary to medical complications (Comment): HR elevation Patient left: in chair;with chair alarm set;with call bell/phone within reach;with SCD's reapplied; B heels elevated via pillow Nurse Communication: Mobility status;Weight bearing status;Precautions PT Visit Diagnosis: Other abnormalities of gait and mobility (R26.89);Muscle weakness (generalized) (M62.81);Difficulty in walking, not elsewhere classified (R26.2);Pain Pain - Right/Left: Right Pain - part of body: Hip     Time: 5621-3086 PT Time Calculation (min) (ACUTE ONLY): 39 min  Charges:                       G Codes:       Jaeshawn Silvio Mondrian-Pardue, SPT 02/01/2018, 10:20 AM

## 2018-02-21 ENCOUNTER — Other Ambulatory Visit: Payer: Self-pay

## 2018-02-21 ENCOUNTER — Ambulatory Visit: Payer: Commercial Managed Care - PPO | Attending: Orthopedic Surgery

## 2018-02-21 DIAGNOSIS — M25551 Pain in right hip: Secondary | ICD-10-CM

## 2018-02-21 DIAGNOSIS — M6281 Muscle weakness (generalized): Secondary | ICD-10-CM

## 2018-02-21 DIAGNOSIS — R262 Difficulty in walking, not elsewhere classified: Secondary | ICD-10-CM | POA: Diagnosis present

## 2018-02-21 NOTE — Patient Instructions (Signed)
Pt was recommended to gently massage around the scar tissue area with clean hands to decrease scar tissue adhesions.   Gave standing glute max squeezes as part of his HEP 10x3 with 5 second holds daily. Pt demonstrated and verbalized understanding.
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
## 2018-02-26 ENCOUNTER — Ambulatory Visit: Payer: Commercial Managed Care - PPO

## 2018-02-28 ENCOUNTER — Ambulatory Visit: Payer: Commercial Managed Care - PPO

## 2018-02-28 DIAGNOSIS — M25551 Pain in right hip: Secondary | ICD-10-CM | POA: Diagnosis not present

## 2018-02-28 DIAGNOSIS — R262 Difficulty in walking, not elsewhere classified: Secondary | ICD-10-CM

## 2018-02-28 DIAGNOSIS — M6281 Muscle weakness (generalized): Secondary | ICD-10-CM
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
## 2018-03-05 ENCOUNTER — Ambulatory Visit: Payer: Commercial Managed Care - PPO

## 2018-03-05 DIAGNOSIS — M25551 Pain in right hip: Secondary | ICD-10-CM

## 2018-03-05 DIAGNOSIS — R262 Difficulty in walking, not elsewhere classified: Secondary | ICD-10-CM

## 2018-03-05 DIAGNOSIS — M6281 Muscle weakness (generalized): Secondary | ICD-10-CM
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
## 2018-03-07 ENCOUNTER — Ambulatory Visit: Payer: Commercial Managed Care - PPO

## 2018-03-07 DIAGNOSIS — M6281 Muscle weakness (generalized): Secondary | ICD-10-CM

## 2018-03-07 DIAGNOSIS — R262 Difficulty in walking, not elsewhere classified: Secondary | ICD-10-CM

## 2018-03-07 DIAGNOSIS — M25551 Pain in right hip: Secondary | ICD-10-CM

## 2018-03-07 NOTE — Patient Instructions (Signed)
Gave static mini forward lunge with R LE 10x3  Static mini lateral lunge with R LE 10x3   SLS R LE with L tip toe assist (as little assist as possible) to promote glute med strengthening. 10x5 seconds x 3 daily (handout provided)   As part of his HEP. Pt demonstrated and verbalized understanding.
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
## 2018-03-12 ENCOUNTER — Ambulatory Visit: Payer: Commercial Managed Care - PPO

## 2018-03-14 ENCOUNTER — Ambulatory Visit: Payer: Commercial Managed Care - PPO

## 2018-03-19 ENCOUNTER — Ambulatory Visit: Payer: Commercial Managed Care - PPO

## 2018-03-21 ENCOUNTER — Ambulatory Visit: Payer: Commercial Managed Care - PPO

## 2018-06-01 ENCOUNTER — Other Ambulatory Visit: Payer: Self-pay

## 2018-06-01 ENCOUNTER — Encounter: Payer: Self-pay | Admitting: Emergency Medicine

## 2018-06-01 ENCOUNTER — Emergency Department: Payer: Commercial Managed Care - PPO

## 2018-06-01 ENCOUNTER — Emergency Department
Admission: EM | Admit: 2018-06-01 | Discharge: 2018-06-01 | Disposition: A | Discharge status: Home / Self Care | Payer: Commercial Managed Care - PPO | Attending: Emergency Medicine | Admitting: Emergency Medicine

## 2018-06-01 DIAGNOSIS — J40 Bronchitis, not specified as acute or chronic: Secondary | ICD-10-CM | POA: Diagnosis not present

## 2018-06-01 DIAGNOSIS — Z21 Asymptomatic human immunodeficiency virus [HIV] infection status: Secondary | ICD-10-CM | POA: Insufficient documentation

## 2018-06-01 DIAGNOSIS — F17210 Nicotine dependence, cigarettes, uncomplicated: Secondary | ICD-10-CM | POA: Diagnosis not present

## 2018-06-01 DIAGNOSIS — R05 Cough: Secondary | ICD-10-CM | POA: Diagnosis present

## 2018-06-01 MED ORDER — METHYLPREDNISOLONE 4 MG PO TBPK: ORAL_TABLET | ORAL | 0 refills | Status: DC

## 2018-06-01 MED ORDER — KETOROLAC TROMETHAMINE 60 MG/2ML IM SOLN
60.0000 mg | Freq: Once | INTRAMUSCULAR | Status: AC
  Administered 2018-06-01: 60 mg via INTRAMUSCULAR
  Filled 2018-06-01: qty 2

## 2018-06-01 MED ORDER — HYDROCOD POLST-CPM POLST ER 10-8 MG/5ML PO SUER: 5.0000 mL | Freq: Two times a day (BID) | ORAL | 0 refills | Status: DC

## 2018-06-01 MED ORDER — BENZONATATE 100 MG PO CAPS: 200.0000 mg | ORAL_CAPSULE | Freq: Three times a day (TID) | ORAL | 0 refills | Status: DC | PRN
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
## 2018-06-01 NOTE — ED Notes (Addendum)
Pt taken to xray 

## 2018-06-01 NOTE — ED Triage Notes (Signed)
First Nurse Note:  C/O non productive cough x 8 days.  States has taken OTC cough medicine, with only worsening symptoms.  Today c/o mid back pain with cough.  AAOx3.  Skin warm and dry. No SOB? DOE.

## 2018-06-01 NOTE — ED Triage Notes (Signed)
States cough x 1 week. Began back pain with cough 3 days ago.

## 2018-06-06 ENCOUNTER — Encounter: Payer: Self-pay | Admitting: Emergency Medicine

## 2018-06-06 ENCOUNTER — Other Ambulatory Visit: Payer: Self-pay

## 2018-06-06 ENCOUNTER — Emergency Department
Admission: EM | Admit: 2018-06-06 | Discharge: 2018-06-06 | Disposition: A | Discharge status: Home / Self Care | Payer: Commercial Managed Care - PPO | Attending: Emergency Medicine | Admitting: Emergency Medicine

## 2018-06-06 DIAGNOSIS — Z96643 Presence of artificial hip joint, bilateral: Secondary | ICD-10-CM | POA: Insufficient documentation

## 2018-06-06 DIAGNOSIS — Y9389 Activity, other specified: Secondary | ICD-10-CM | POA: Insufficient documentation

## 2018-06-06 DIAGNOSIS — Y929 Unspecified place or not applicable: Secondary | ICD-10-CM | POA: Insufficient documentation

## 2018-06-06 DIAGNOSIS — Z79899 Other long term (current) drug therapy: Secondary | ICD-10-CM | POA: Diagnosis not present

## 2018-06-06 DIAGNOSIS — S39012A Strain of muscle, fascia and tendon of lower back, initial encounter: Secondary | ICD-10-CM

## 2018-06-06 DIAGNOSIS — Y999 Unspecified external cause status: Secondary | ICD-10-CM | POA: Diagnosis not present

## 2018-06-06 DIAGNOSIS — F17210 Nicotine dependence, cigarettes, uncomplicated: Secondary | ICD-10-CM | POA: Diagnosis not present

## 2018-06-06 DIAGNOSIS — Z21 Asymptomatic human immunodeficiency virus [HIV] infection status: Secondary | ICD-10-CM | POA: Insufficient documentation

## 2018-06-06 DIAGNOSIS — S3992XA Unspecified injury of lower back, initial encounter: Secondary | ICD-10-CM | POA: Diagnosis present

## 2018-06-06 DIAGNOSIS — X500XXA Overexertion from strenuous movement or load, initial encounter: Secondary | ICD-10-CM | POA: Insufficient documentation

## 2018-06-06 MED ORDER — METHOCARBAMOL 500 MG PO TABS: ORAL_TABLET | ORAL | 0 refills | Status: DC

## 2018-06-06 MED ORDER — OXYCODONE-ACETAMINOPHEN 5-325 MG PO TABS
1.0000 | ORAL_TABLET | Freq: Once | ORAL | Status: AC
  Administered 2018-06-06: 1 via ORAL
  Filled 2018-06-06: qty 1

## 2018-06-06 MED ORDER — ALBUTEROL SULFATE HFA 108 (90 BASE) MCG/ACT IN AERS: 2.0000 | INHALATION_SPRAY | Freq: Four times a day (QID) | RESPIRATORY_TRACT | 2 refills | Status: DC | PRN

## 2018-06-06 MED ORDER — METHOCARBAMOL 500 MG PO TABS
1000.0000 mg | ORAL_TABLET | Freq: Once | ORAL | Status: AC
Start: 2018-06-06 — End: 2018-06-06
  Administered 2018-06-06: 1000 mg via ORAL
  Filled 2018-06-06: qty 2

## 2018-06-06 MED ORDER — OXYCODONE-ACETAMINOPHEN 5-325 MG PO TABS: 1.0000 | ORAL_TABLET | Freq: Four times a day (QID) | ORAL | 0 refills | Status: DC | PRN

## 2018-06-06 NOTE — Discharge Instructions (Addendum)
Teen taking medication as directed on your antibiotic until finished.  A prescription for an albuterol inhaler was also written to help with coughing.  A prescription for Robaxin 500 mg 1 or 2 tablets every 6 hours as needed for muscle spasms and Percocet every 6 hours as needed for severe pain.  [facility] your [facility] if any continued problems.  You may also use ice or heat to your back as needed for discomfort.

## 2018-06-06 NOTE — ED Triage Notes (Signed)
Pt reports that he was seen her Saturday for cough and SOB, states that it is getting better with the medication he was given. His complaint today is that when he coughs that he gets a pain in his back that feels like it is catching. He is able to speak in complete clear sentences.
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
## 2019-02-06 ENCOUNTER — Ambulatory Visit: Payer: Commercial Managed Care - PPO | Attending: Infectious Diseases | Admitting: Infectious Diseases

## 2019-02-06 ENCOUNTER — Encounter: Payer: Self-pay | Admitting: Infectious Diseases

## 2019-02-06 ENCOUNTER — Other Ambulatory Visit
Admission: RE | Admit: 2019-02-06 | Discharge: 2019-02-06 | Disposition: A | Discharge status: Home / Self Care | Payer: Commercial Managed Care - PPO | Source: Ambulatory Visit | Attending: Infectious Diseases | Admitting: Infectious Diseases

## 2019-02-06 VITALS — BP 153/98 | HR 71 | Temp 98.1°F | Ht 65.0 in | Wt 191.4 lb

## 2019-02-06 DIAGNOSIS — Z8739 Personal history of other diseases of the musculoskeletal system and connective tissue: Secondary | ICD-10-CM

## 2019-02-06 DIAGNOSIS — B20 Human immunodeficiency virus [HIV] disease: Secondary | ICD-10-CM

## 2019-02-06 DIAGNOSIS — Z79899 Other long term (current) drug therapy: Secondary | ICD-10-CM

## 2019-02-06 DIAGNOSIS — Z23 Encounter for immunization: Secondary | ICD-10-CM

## 2019-02-06 DIAGNOSIS — Z87448 Personal history of other diseases of urinary system: Secondary | ICD-10-CM

## 2019-02-06 DIAGNOSIS — Z87891 Personal history of nicotine dependence: Secondary | ICD-10-CM

## 2019-02-06 LAB — COMPREHENSIVE METABOLIC PANEL
ALT: 24 U/L (ref 0–44)
AST: 29 U/L (ref 15–41)
Albumin: 4.6 g/dL (ref 3.5–5.0)
Alkaline Phosphatase: 83 U/L (ref 38–126)
Anion gap: 7 (ref 5–15)
BUN: 17 mg/dL (ref 6–20)
CO2: 27 mmol/L (ref 22–32)
Calcium: 9.6 mg/dL (ref 8.9–10.3)
Chloride: 104 mmol/L (ref 98–111)
Creatinine, Ser: 1.46 mg/dL — ABNORMAL HIGH (ref 0.61–1.24)
GFR calc Af Amer: >60 mL/min (ref >60)
GFR calc non Af Amer: 54 mL/min — ABNORMAL LOW (ref >60)
Glucose, Bld: 91 mg/dL (ref 70–99)
Potassium: 4.3 mmol/L (ref 3.5–5.1)
Sodium: 138 mmol/L (ref 135–145)
TOTAL PROTEIN: 8.1 g/dL (ref 6.5–8.1)
Total Bilirubin: 1.1 mg/dL (ref 0.3–1.2)

## 2019-02-06 MED ORDER — ABACAVIR-DOLUTEGRAVIR-LAMIVUD 600-50-300 MG PO TABS: 1.0000 | ORAL_TABLET | Freq: Every day | ORAL | 3 refills | Status: DC

## 2019-02-06 NOTE — Patient Instructions (Addendum)
You are here to engage in HIV care- you are on triumeq- today will do labs . Also you got TdaP vaccine and Prevnar vaccine ( pneumococcal 13) today. You will need colonoscopy-  Please call Dr.Kiran Tobi Bastos 's office to schedule an appt for colonoscopy  [phone]   [address]   [address]   [place] [address]   Will see you in 1 month
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
## 2019-02-07 LAB — T-HELPER CELLS CD4/CD8 %
% CD 4 Pos. Lymph.: 16.8 % — ABNORMAL LOW (ref 30.8–58.5)
Absolute CD 4 Helper: 252 /uL — ABNORMAL LOW (ref 359–1519)
Basophils Absolute: 0 10*3/uL (ref 0.0–0.2)
Basos: 1 %
CD3+CD4+ Cells/CD3+CD8+ Cells Bld: 0.38 — ABNORMAL LOW (ref 0.92–3.72)
CD3+CD8+ Cells # Bld: 672 /uL (ref 109–897)
CD3+CD8+ Cells NFr Bld: 44.8 % — ABNORMAL HIGH (ref 12.0–35.5)
EOS (ABSOLUTE): 0.1 10*3/uL (ref 0.0–0.4)
Eos: 2 %
HEMATOCRIT: 49.7 % (ref 37.5–51.0)
Hemoglobin: 18 g/dL — ABNORMAL HIGH (ref 13.0–17.7)
Immature Grans (Abs): 0 10*3/uL (ref 0.0–0.1)
Immature Granulocytes: 0 %
Lymphocytes Absolute: 1.5 10*3/uL (ref 0.7–3.1)
Lymphs: 31 %
MCH: 35.6 pg — ABNORMAL HIGH (ref 26.6–33.0)
MCHC: 36.2 g/dL — ABNORMAL HIGH (ref 31.5–35.7)
MCV: 98 fL — ABNORMAL HIGH (ref 79–97)
MONOS ABS: 0.5 10*3/uL (ref 0.1–0.9)
Monocytes: 10 %
Neutrophils Absolute: 2.8 10*3/uL (ref 1.4–7.0)
Neutrophils: 56 %
PLATELETS: 163 10*3/uL (ref 150–450)
RBC: 5.05 x10E6/uL (ref 4.14–5.80)
RDW: 11.6 % (ref 11.6–15.4)
WBC: 4.9 10*3/uL (ref 3.4–10.8)

## 2019-02-07 LAB — HEPATITIS PANEL, ACUTE
HCV Ab: >11 s/co ratio — ABNORMAL HIGH (ref 0.0–0.9)
Hep A IgM: NEGATIVE
Hep B C IgM: NEGATIVE
Hepatitis B Surface Ag: NEGATIVE

## 2019-02-07 LAB — RPR: RPR: NONREACTIVE

## 2019-02-07 LAB — HIV-1 RNA QUANT-NO REFLEX-BLD
HIV 1 RNA Quant: 20 copies/mL
LOG10 HIV-1 RNA: 1.301 log10copy/mL

## 2019-02-09 LAB — QUANTIFERON-TB GOLD PLUS (RQFGPL)
QuantiFERON Mitogen Value: >10 IU/mL
QuantiFERON Nil Value: 0.06 IU/mL
QuantiFERON TB1 Ag Value: 0.05 IU/mL
QuantiFERON TB2 Ag Value: 0.04 IU/mL

## 2019-02-09 LAB — QUANTIFERON-TB GOLD PLUS: QuantiFERON-TB Gold Plus: NEGATIVE

## 2019-03-11 ENCOUNTER — Ambulatory Visit: Payer: Commercial Managed Care - PPO | Admitting: Infectious Diseases

## 2019-06-05 ENCOUNTER — Other Ambulatory Visit
Admission: RE | Admit: 2019-06-05 | Discharge: 2019-06-05 | Disposition: A | Discharge status: Home / Self Care | Payer: Commercial Managed Care - PPO | Attending: Infectious Diseases | Admitting: Infectious Diseases

## 2019-06-05 ENCOUNTER — Other Ambulatory Visit: Payer: Self-pay

## 2019-06-05 ENCOUNTER — Ambulatory Visit: Payer: Commercial Managed Care - PPO | Attending: Infectious Diseases | Admitting: Infectious Diseases

## 2019-06-05 VITALS — BP 147/93 | HR 116 | Temp 97.7°F | Wt 201.2 lb

## 2019-06-05 DIAGNOSIS — Z79899 Other long term (current) drug therapy: Secondary | ICD-10-CM

## 2019-06-05 DIAGNOSIS — Z87891 Personal history of nicotine dependence: Secondary | ICD-10-CM | POA: Diagnosis not present

## 2019-06-05 DIAGNOSIS — Z96643 Presence of artificial hip joint, bilateral: Secondary | ICD-10-CM | POA: Diagnosis not present

## 2019-06-05 DIAGNOSIS — B20 Human immunodeficiency virus [HIV] disease: Secondary | ICD-10-CM

## 2019-06-05 DIAGNOSIS — R030 Elevated blood-pressure reading, without diagnosis of hypertension: Secondary | ICD-10-CM | POA: Diagnosis not present

## 2019-06-05 LAB — CHLAMYDIA/NGC RT PCR (ARMC ONLY)
Chlamydia Tr: NOT DETECTED
N gonorrhoeae: NOT DETECTED

## 2019-06-05 LAB — LIPID PANEL
Cholesterol: 178 mg/dL (ref 0–200)
HDL: 53 mg/dL (ref >40)
LDL Cholesterol: 105 mg/dL — ABNORMAL HIGH (ref 0–99)
Total CHOL/HDL Ratio: 3.4 RATIO
Triglycerides: 100 mg/dL (ref <150)
VLDL: 20 mg/dL (ref 0–40)

## 2019-06-05 LAB — BASIC METABOLIC PANEL
Anion gap: 10 (ref 5–15)
BUN: 28 mg/dL — ABNORMAL HIGH (ref 6–20)
CO2: 26 mmol/L (ref 22–32)
Calcium: 9.7 mg/dL (ref 8.9–10.3)
Chloride: 103 mmol/L (ref 98–111)
Creatinine, Ser: 1.72 mg/dL — ABNORMAL HIGH (ref 0.61–1.24)
GFR calc Af Amer: 51 mL/min — ABNORMAL LOW (ref >60)
GFR calc non Af Amer: 44 mL/min — ABNORMAL LOW (ref >60)
Glucose, Bld: 103 mg/dL — ABNORMAL HIGH (ref 70–99)
Potassium: 4 mmol/L (ref 3.5–5.1)
Sodium: 139 mmol/L (ref 135–145)

## 2019-06-05 MED ORDER — TRIUMEQ 600-50-300 MG PO TABS: 1.0000 | ORAL_TABLET | Freq: Every day | ORAL | 5 refills | Status: AC
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
## 2019-06-05 NOTE — Patient Instructions (Signed)
You are here fpr follow up visit- you have been taking trumeq regularly- you need labs and you will have to come fasting 8-10 hrs for that. Will send refills to your pharmacy today

## 2019-06-06 LAB — T-HELPER CELLS CD4/CD8 %
% CD 4 Pos. Lymph.: 16.4 % — ABNORMAL LOW (ref 30.8–58.5)
Absolute CD 4 Helper: 262 /uL — ABNORMAL LOW (ref 359–1519)
Basophils Absolute: 0.1 10*3/uL (ref 0.0–0.2)
Basos: 1 %
CD3+CD4+ Cells/CD3+CD8+ Cells Bld: 0.39 — ABNORMAL LOW (ref 0.92–3.72)
CD3+CD8+ Cells # Bld: 670 /uL (ref 109–897)
CD3+CD8+ Cells NFr Bld: 41.9 % — ABNORMAL HIGH (ref 12.0–35.5)
EOS (ABSOLUTE): 0.1 10*3/uL (ref 0.0–0.4)
Eos: 1 %
Hematocrit: 49.4 % (ref 37.5–51.0)
Hemoglobin: 17.6 g/dL (ref 13.0–17.7)
Immature Grans (Abs): 0 10*3/uL (ref 0.0–0.1)
Immature Granulocytes: 0 %
Lymphocytes Absolute: 1.6 10*3/uL (ref 0.7–3.1)
Lymphs: 22 %
MCH: 35.7 pg — ABNORMAL HIGH (ref 26.6–33.0)
MCHC: 35.6 g/dL (ref 31.5–35.7)
MCV: 100 fL — ABNORMAL HIGH (ref 79–97)
Monocytes Absolute: 0.6 10*3/uL (ref 0.1–0.9)
Monocytes: 9 %
Neutrophils Absolute: 4.9 10*3/uL (ref 1.4–7.0)
Neutrophils: 67 %
Platelets: 181 10*3/uL (ref 150–450)
RBC: 4.93 x10E6/uL (ref 4.14–5.80)
RDW: 12.7 % (ref 11.6–15.4)
WBC: 7.3 10*3/uL (ref 3.4–10.8)

## 2019-06-06 LAB — HEPATITIS B SURFACE ANTIBODY, QUANTITATIVE: Hep B S AB Quant (Post): 5.9 m[IU]/mL — ABNORMAL LOW (ref >9.9)

## 2019-06-07 LAB — HIV-1 RNA QUANT-NO REFLEX-BLD
HIV 1 RNA Quant: <20 copies/mL
LOG10 HIV-1 RNA: UNDETERMINED log10copy/mL

## 2019-06-11 LAB — HEPATITIS C GENOTYPE

## 2019-06-11 LAB — HCV RNA QUANT RFLX ULTRA OR GENOTYP
HCV RNA Qnt(log copy/mL): 6.053 log10 IU/mL
HepC Qn: 1130000 IU/mL

## 2019-10-13 IMAGING — DX DG HIP (WITH OR WITHOUT PELVIS) 2-3V*R*
2 series · 2 of 2 positions shown · non-contrast
Comparison: None.

CLINICAL DATA: 51-year-old male with a history of right hip
arthroplasty

EXAM:
DG HIP (WITH OR WITHOUT PELVIS) 2-3V RIGHT

[hip ap]
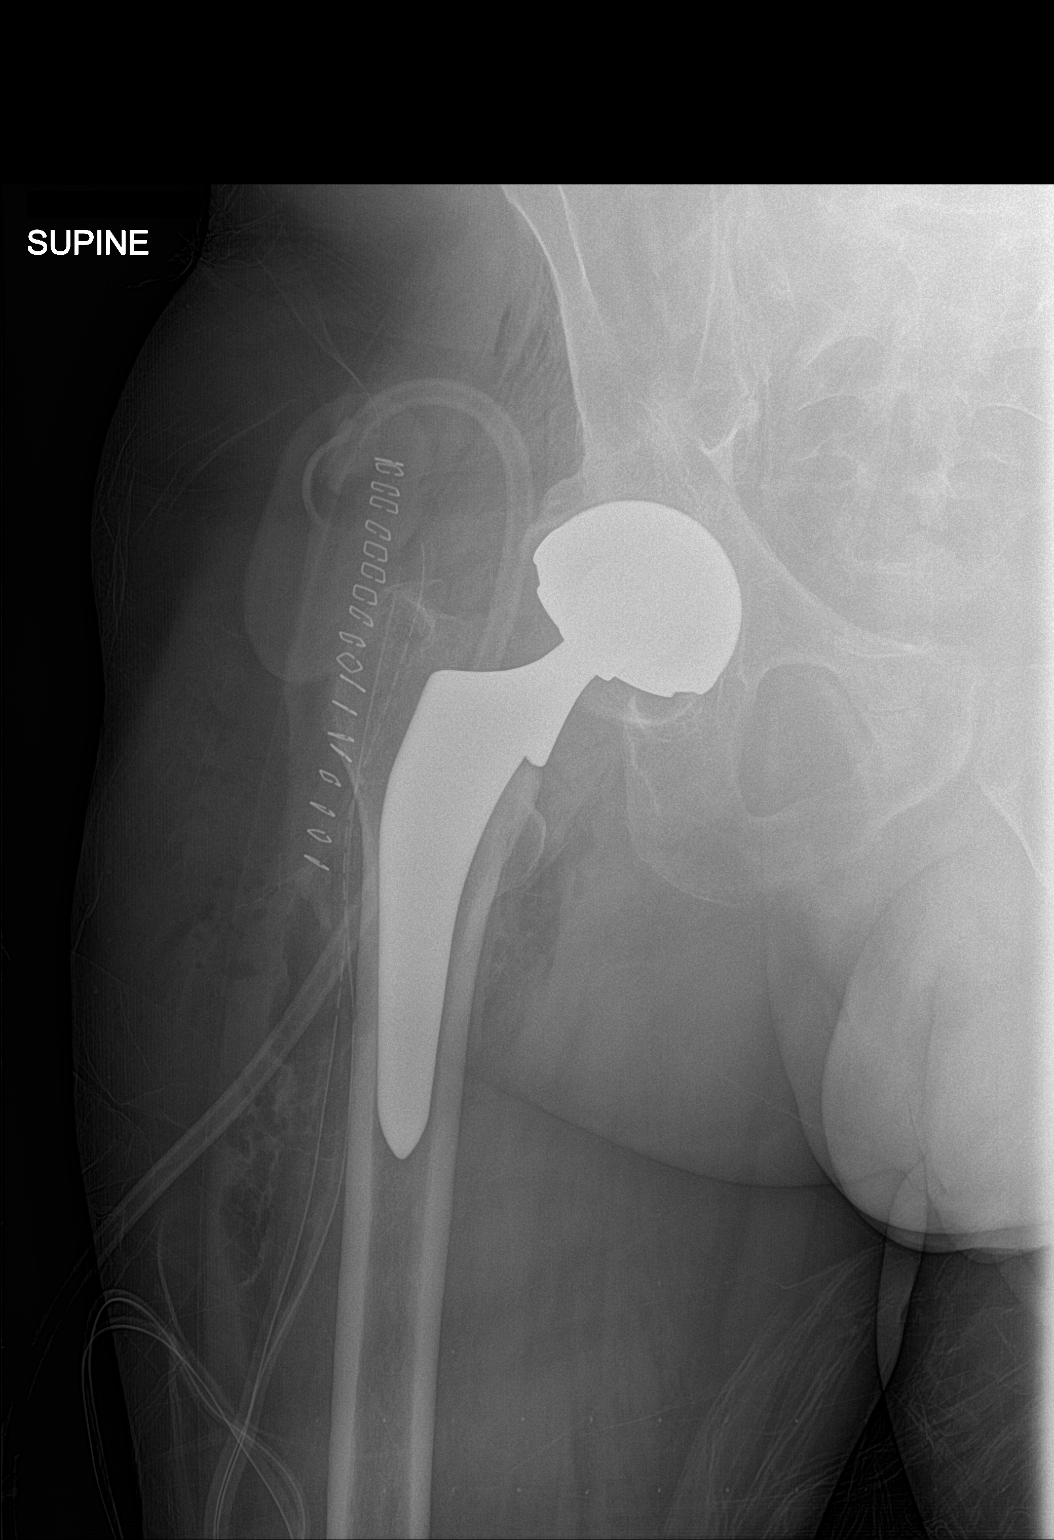

[hip lat]
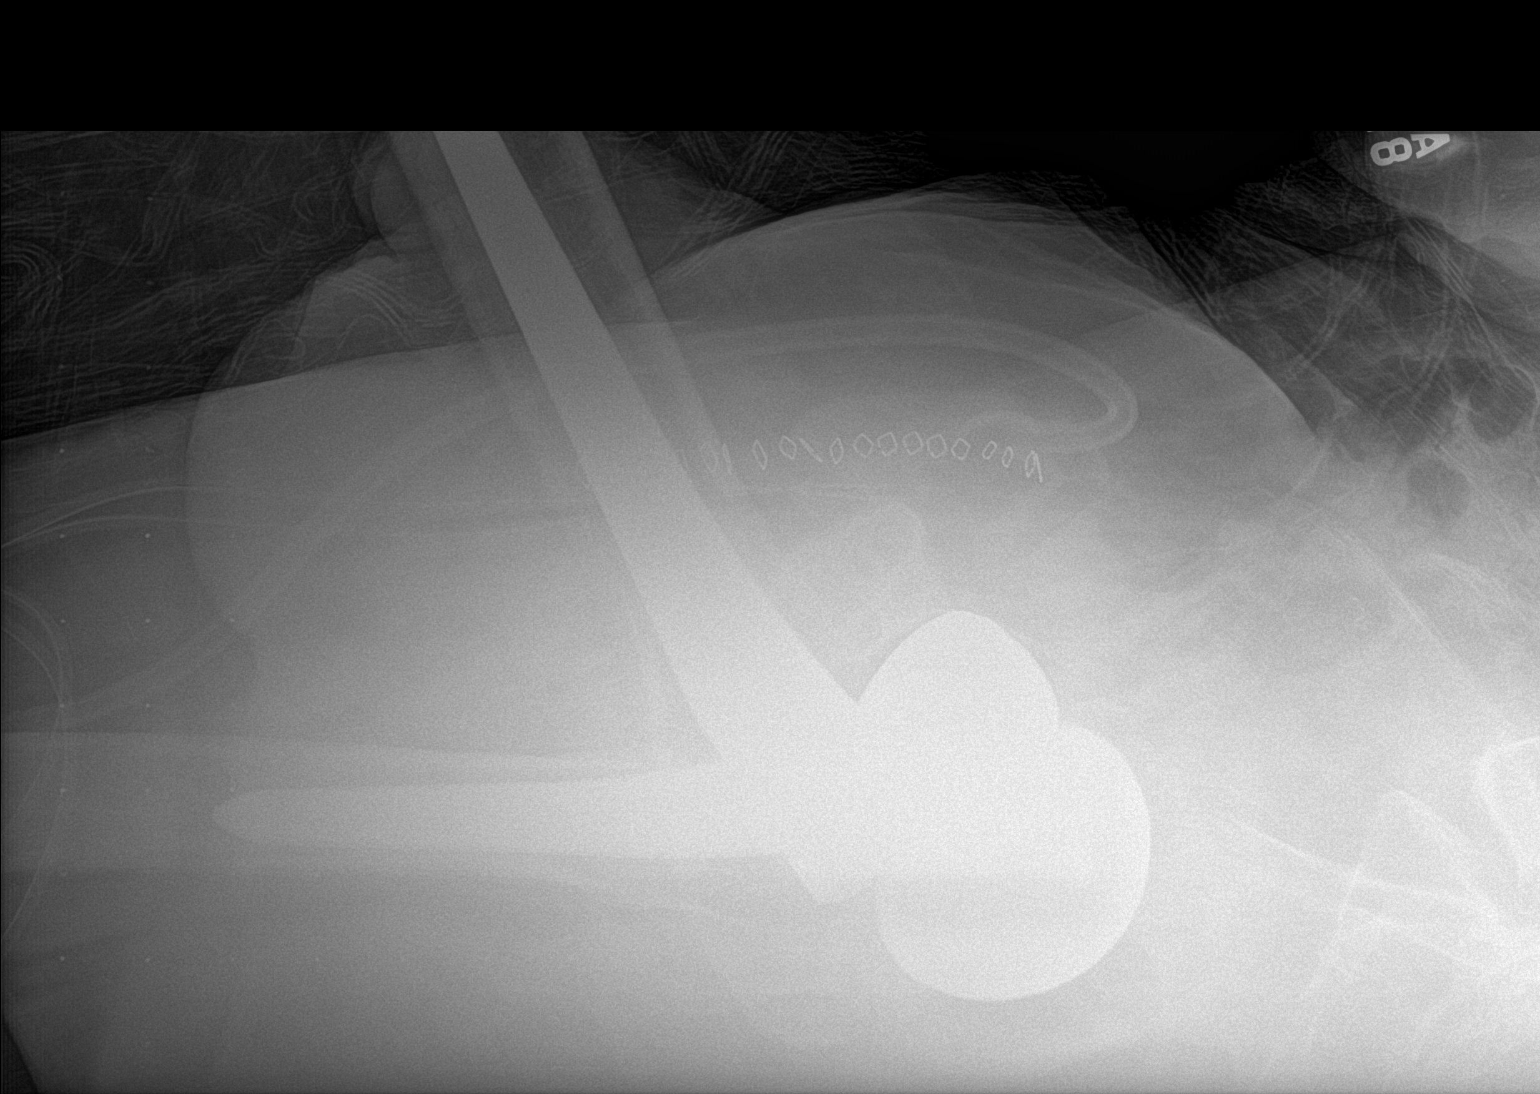

[2 of 2 positions shown; findings below may reference images not displayed]

FINDINGS: Two views of the right hip demonstrate surgical changes of right hip
arthroplasty. Surgical drains within the soft tissues with gas
projecting at the surgical site. Surgical clips within the overlying
soft tissues.

Components appear congruent.  No acute fracture.
IMPRESSION: Early surgical changes of right hip arthroplasty without
complicating features.

## 2019-10-15 IMAGING — CR DG CHEST 2V
1 series · 2 of 2 positions shown · non-contrast
Comparison: 03/18/2016

CLINICAL DATA: Recent hip replacement with tachycardia

EXAM:
CHEST  2 VIEW

[Series 1: dg chest 2 view · 0.14mm/px · 2 of 2 slices shown]
[im 1/2]
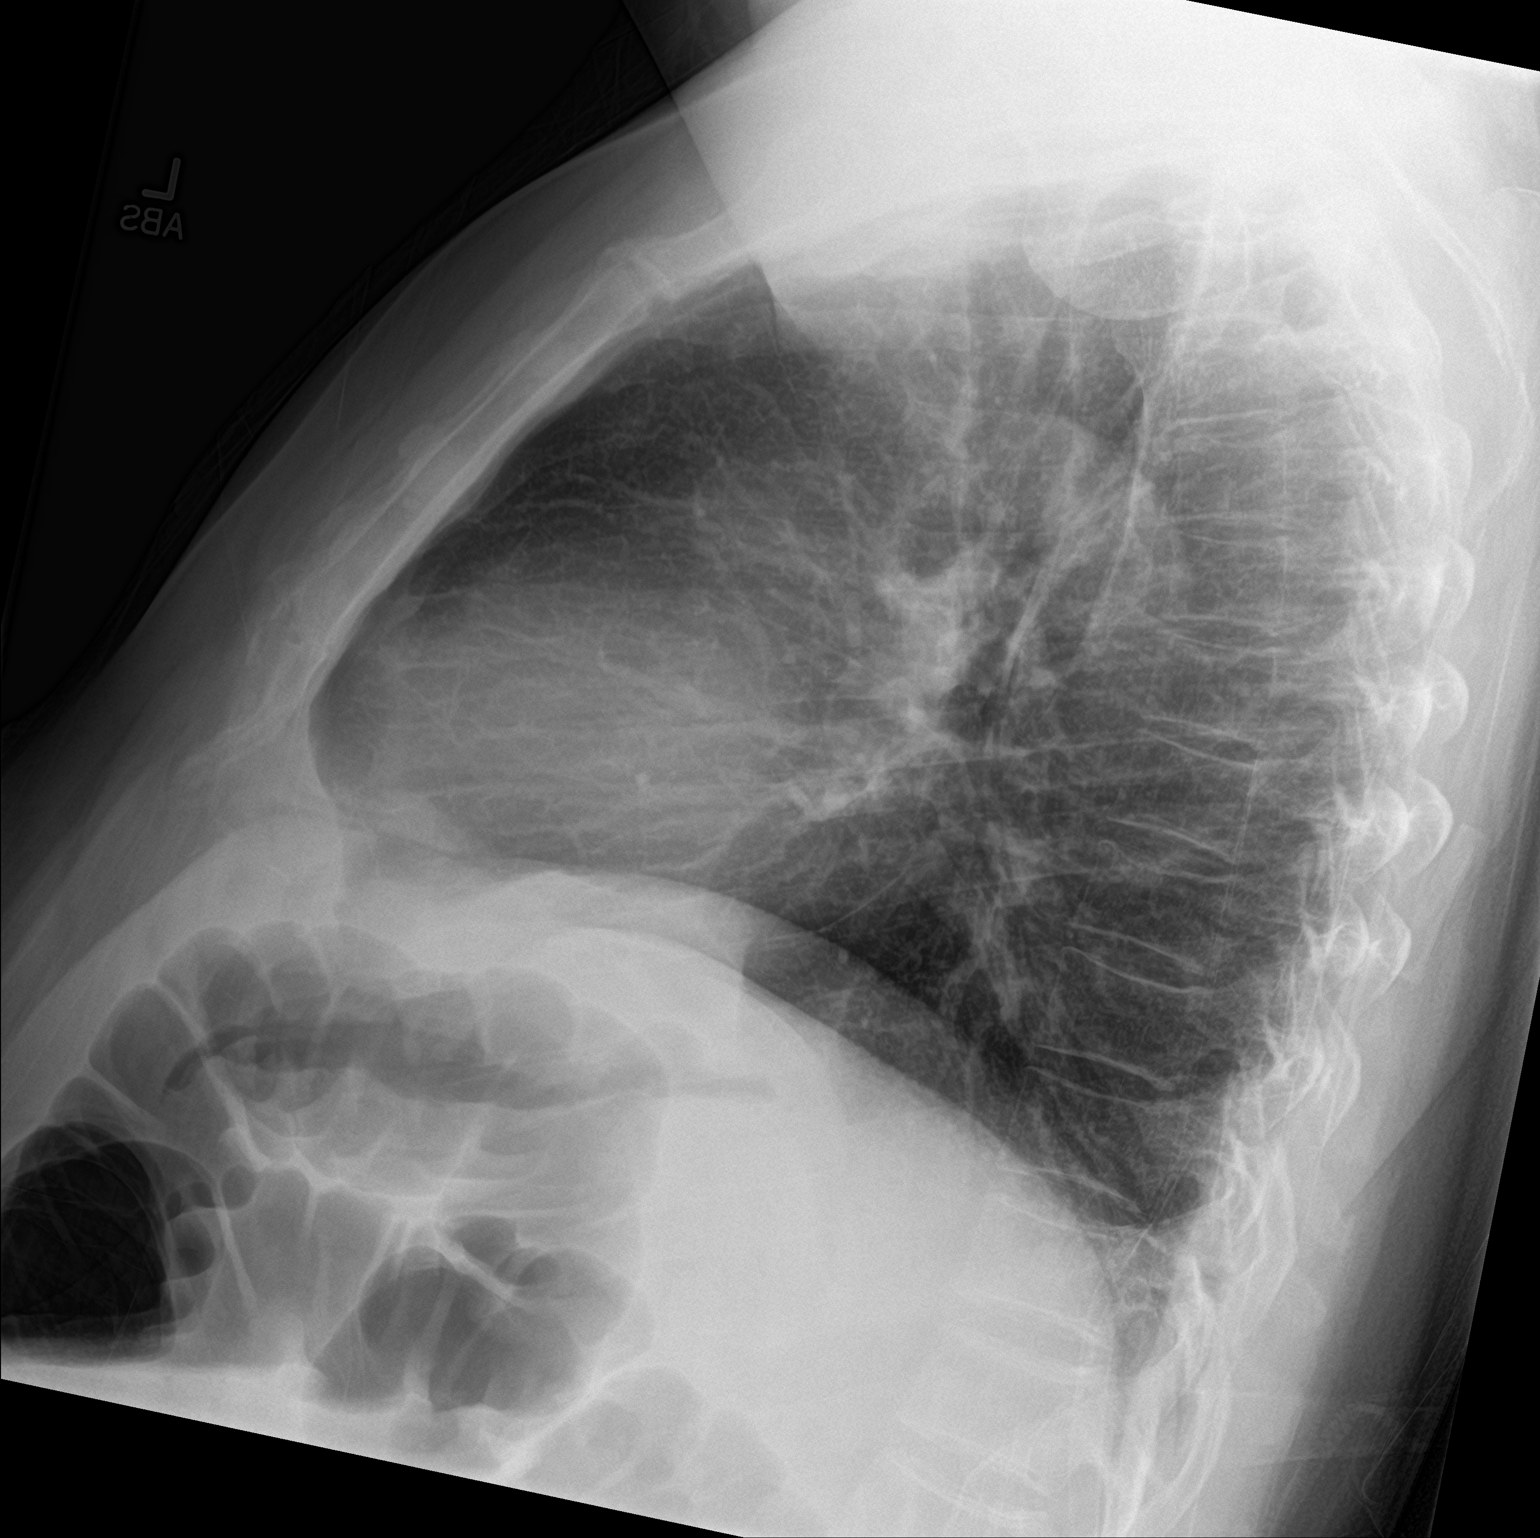
[im 2/2]
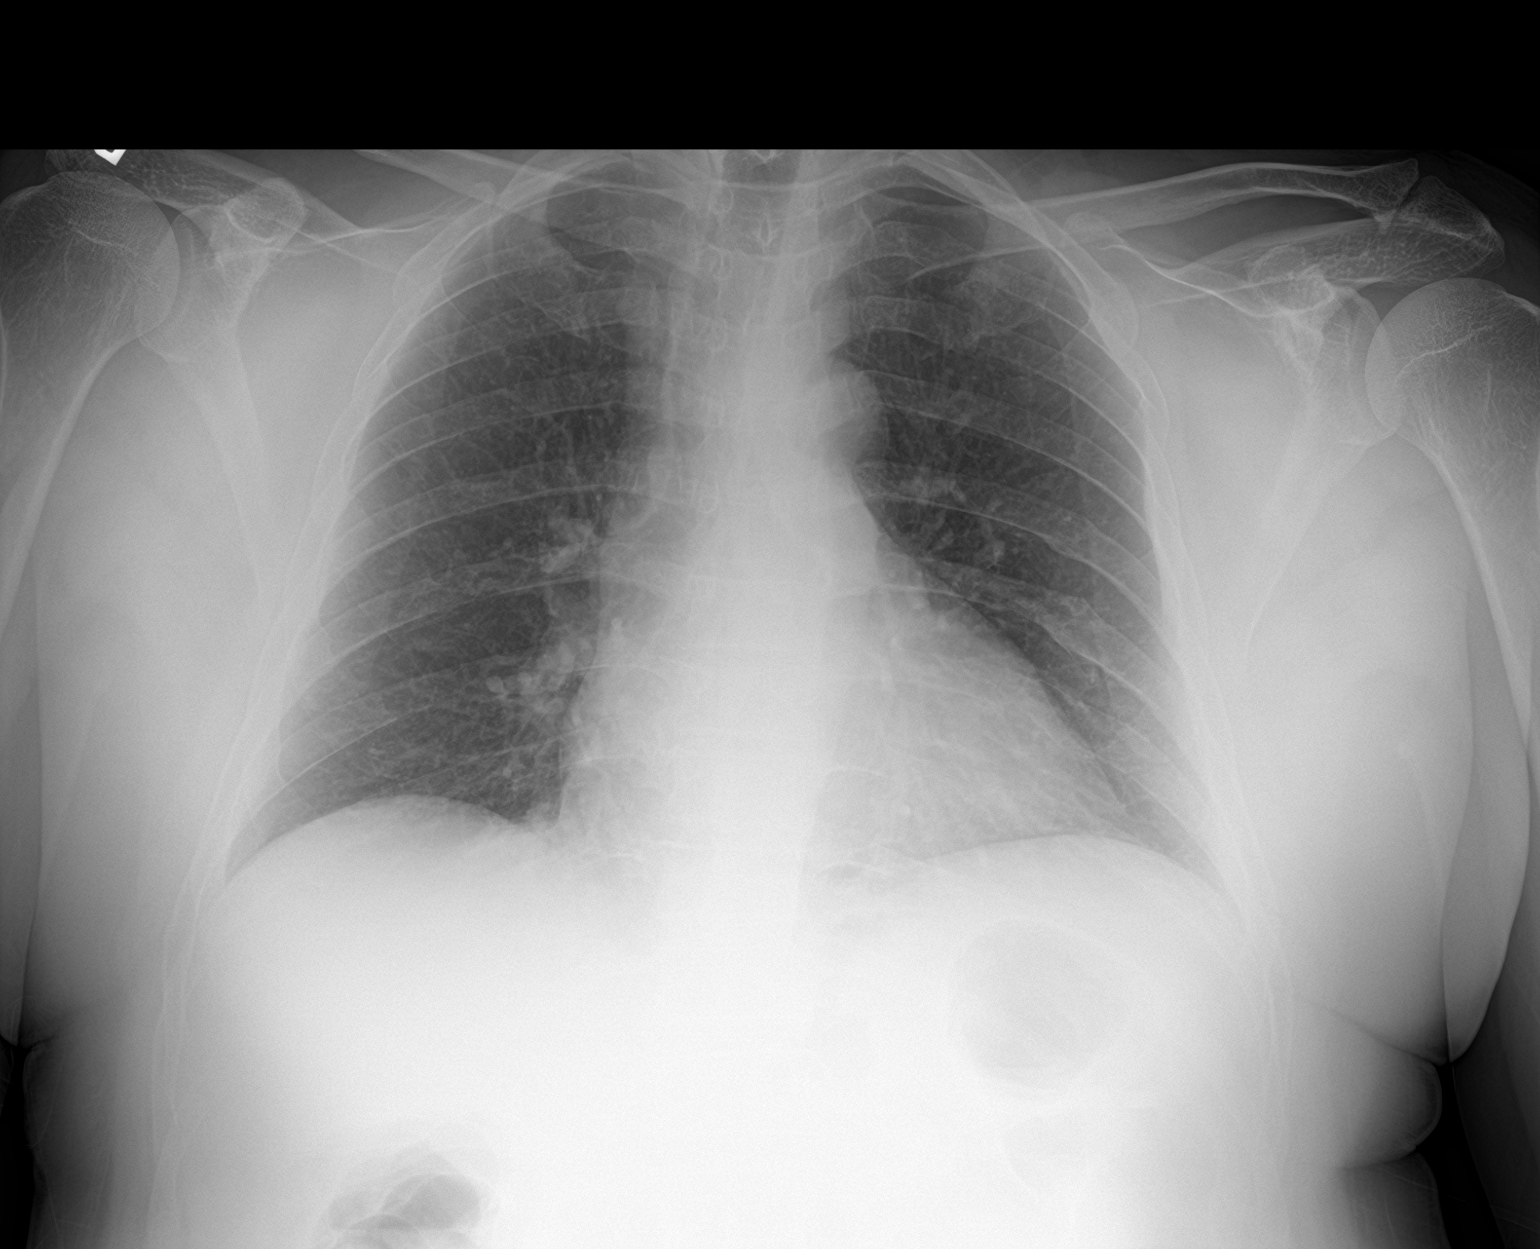

[2 of 2 positions shown; findings below may reference images not displayed]

FINDINGS: The heart size and mediastinal contours are within normal limits.
Both lungs are clear. The visualized skeletal structures are
unremarkable.
IMPRESSION: No active cardiopulmonary disease.

## 2019-10-15 IMAGING — NM NM PULMONARY VENT & PERF
2 series · 16 of 16 positions shown · non-contrast
Comparison: None.

CLINICAL DATA: Shortness of Breath following recent hip surge

EXAM:
NUCLEAR MEDICINE VENTILATION - PERFUSION LUNG SCAN
TECHNIQUE: Ventilation images were obtained in multiple projections using
inhaled aerosol Qc-TTm DTPA. Perfusion images were obtained in
multiple projections after intravenous injection of [id number]-PSS.
RADIOPHARMACEUTICALS:  31.44 mCi of Qc-TTm DTPA aerosol inhalation
and 3.98 mCi [id number]-M99 IV

[Series 1000: lung perfusion · 1.95mm/px · 4 acquisitions, 8 frames shown]
[im 1/4]
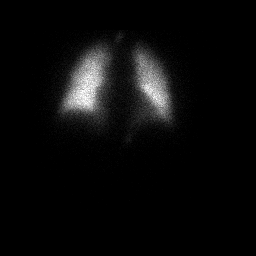
[im 1/4]
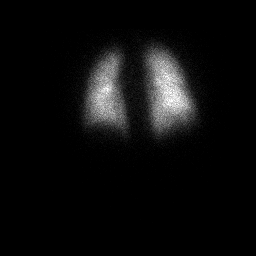
[im 2/4]
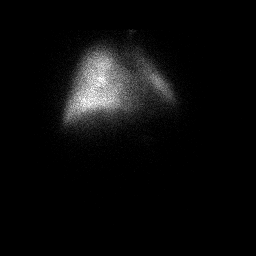
[im 2/4]
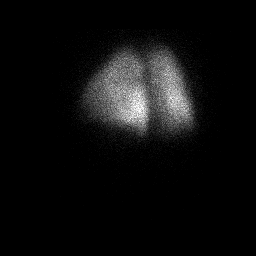
[im 3/4]
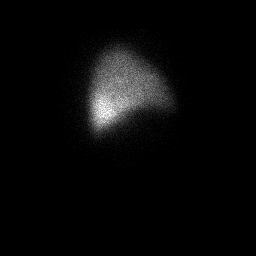
[im 3/4]
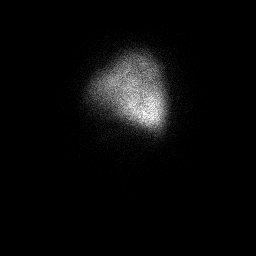
[im 4/4]
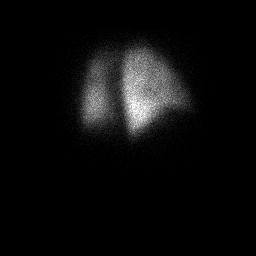
[im 4/4]
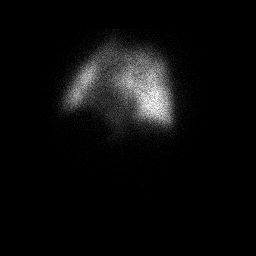

[Series 1000: lung ventilation · 3.90mm/px · 4 acquisitions, 8 frames shown]
[im 1/4]
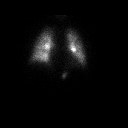
[im 1/4]
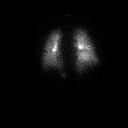
[im 2/4]
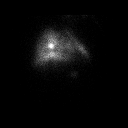
[im 2/4]
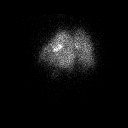
[im 3/4]
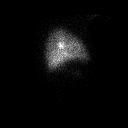
[im 3/4]
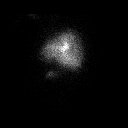
[im 4/4]
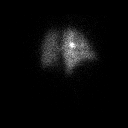
[im 4/4]
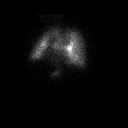

[16 of 16 positions shown; findings below may reference images not displayed]

FINDINGS: Ventilation: Ventilation images demonstrate no large ventilation
defect. Some mild trapping is noted centrally.

Perfusion: Perfusion images demonstrate no definitive fusion defect
to suggest pulmonary embolism.
IMPRESSION: No definitive ventilation perfusion defect is noted to suggest
pulmonary embolism.

## 2020-11-24 LAB — EXTERNAL GENERIC LAB PROCEDURE: COLOGUARD: NEGATIVE

## 2021-06-16 ENCOUNTER — Other Ambulatory Visit (HOSPITAL_COMMUNITY): Payer: Self-pay

## 2024-01-31 ENCOUNTER — Other Ambulatory Visit: Payer: Self-pay | Admitting: Infectious Diseases

## 2024-01-31 DIAGNOSIS — N1832 Chronic kidney disease, stage 3b: Secondary | ICD-10-CM

## 2024-01-31 DIAGNOSIS — I10 Essential (primary) hypertension: Secondary | ICD-10-CM

## 2024-02-18 LAB — EXTERNAL GENERIC LAB PROCEDURE: COLOGUARD: POSITIVE — AB

## 2024-02-27 ENCOUNTER — Ambulatory Visit
Admission: RE | Admit: 2024-02-27 | Discharge: 2024-02-27 | Disposition: A | Discharge status: Home / Self Care | Payer: Self-pay | Source: Ambulatory Visit | Attending: Infectious Diseases | Admitting: Infectious Diseases

## 2024-02-27 DIAGNOSIS — N1832 Chronic kidney disease, stage 3b: Secondary | ICD-10-CM | POA: Insufficient documentation

## 2024-02-27 DIAGNOSIS — I10 Essential (primary) hypertension: Secondary | ICD-10-CM | POA: Insufficient documentation

## 2024-09-03 ENCOUNTER — Ambulatory Visit: Admitting: Certified Registered"

## 2024-09-03 ENCOUNTER — Ambulatory Visit
Admission: RE | Admit: 2024-09-03 | Discharge: 2024-09-03 | Disposition: A | Discharge status: Home / Self Care | Payer: Self-pay | Attending: Gastroenterology | Admitting: Gastroenterology

## 2024-09-03 ENCOUNTER — Encounter
Admission: RE | Disposition: A | Discharge status: Home / Self Care | Payer: Self-pay | Source: Home / Self Care | Attending: Gastroenterology

## 2024-09-03 ENCOUNTER — Encounter: Payer: Self-pay | Admitting: Gastroenterology

## 2024-09-03 DIAGNOSIS — Z1211 Encounter for screening for malignant neoplasm of colon: Secondary | ICD-10-CM | POA: Diagnosis present

## 2024-09-03 DIAGNOSIS — Z21 Asymptomatic human immunodeficiency virus [HIV] infection status: Secondary | ICD-10-CM | POA: Diagnosis not present

## 2024-09-03 DIAGNOSIS — K640 First degree hemorrhoids: Secondary | ICD-10-CM | POA: Insufficient documentation

## 2024-09-03 DIAGNOSIS — D122 Benign neoplasm of ascending colon: Secondary | ICD-10-CM | POA: Insufficient documentation

## 2024-09-03 DIAGNOSIS — R195 Other fecal abnormalities: Secondary | ICD-10-CM | POA: Diagnosis not present

## 2024-09-03 DIAGNOSIS — Z87891 Personal history of nicotine dependence: Secondary | ICD-10-CM | POA: Diagnosis not present

## 2024-09-03 DIAGNOSIS — K635 Polyp of colon: Secondary | ICD-10-CM | POA: Insufficient documentation

## 2024-09-03 HISTORY — PX: COLONOSCOPY: SHX5424

## 2024-09-03 HISTORY — PX: POLYPECTOMY: SHX149

## 2024-09-03 SURGERY — COLONOSCOPY
Anesthesia: General

## 2024-09-03 MED ORDER — PHENYLEPHRINE 80 MCG/ML (10ML) SYRINGE FOR IV PUSH (FOR BLOOD PRESSURE SUPPORT)
PREFILLED_SYRINGE | INTRAVENOUS | Status: DC | PRN
  Administered 2024-09-03: 80 ug via INTRAVENOUS
  Administered 2024-09-03: 160 ug via INTRAVENOUS

## 2024-09-03 MED ORDER — LIDOCAINE HCL (CARDIAC) PF 100 MG/5ML IV SOSY
PREFILLED_SYRINGE | INTRAVENOUS | Status: DC | PRN
  Administered 2024-09-03: 100 mg via INTRAVENOUS

## 2024-09-03 MED ORDER — MIDAZOLAM HCL 2 MG/2ML IJ SOLN
INTRAMUSCULAR | Status: AC
  Filled 2024-09-03: qty 2

## 2024-09-03 MED ORDER — PROPOFOL 10 MG/ML IV BOLUS
INTRAVENOUS | Status: DC | PRN
  Administered 2024-09-03: 20 mg via INTRAVENOUS
  Administered 2024-09-03: 60 mg via INTRAVENOUS

## 2024-09-03 MED ORDER — PROPOFOL 500 MG/50ML IV EMUL
INTRAVENOUS | Status: DC | PRN
  Administered 2024-09-03: 165 ug/kg/min via INTRAVENOUS

## 2024-09-03 MED ORDER — MIDAZOLAM HCL 2 MG/2ML IJ SOLN
INTRAMUSCULAR | Status: DC | PRN
  Administered 2024-09-03: 2 mg via INTRAVENOUS

## 2024-09-03 MED ORDER — GLYCOPYRROLATE 0.2 MG/ML IJ SOLN
INTRAMUSCULAR | Status: DC | PRN
  Administered 2024-09-03: .2 mg via INTRAVENOUS

## 2024-09-03 MED ORDER — SODIUM CHLORIDE 0.9 % IV SOLN
INTRAVENOUS | Status: DC
  Administered 2024-09-03: 500 mL via INTRAVENOUS

## 2024-09-03 NOTE — Transfer of Care (Signed)
 Immediate Anesthesia Transfer of Care Note  Patient: Roy Gonzalez  Procedure(s) Performed: COLONOSCOPY POLYPECTOMY, INTESTINE  Patient Location: Endoscopy Unit  Anesthesia Type:General  Level of Consciousness: drowsy and patient cooperative  Airway & Oxygen Therapy: Patient Spontanous Breathing and Patient connected to face mask oxygen  Post-op Assessment: Report given to RN and Post -op Vital signs reviewed and stable  Post vital signs: Reviewed and stable  Last Vitals:  Vitals Value Taken Time  BP 94/61 09/03/24 09:56  Temp    Pulse 69 09/03/24 09:58  Resp 12 09/03/24 09:58  SpO2 97 % 09/03/24 09:58  Vitals shown include unfiled device data.  Last Pain:  Vitals:   09/03/24 0956  TempSrc:   PainSc: 0-No pain         Complications: No notable events documented.

## 2024-09-03 NOTE — Op Note (Signed)
 [facility] Patient Name: Roy Gonzalez Procedure Date: 09/03/2024 9:11 AM MRN: [id number] Account #: [id number] Date of Birth: [birth year] Admit Type: Outpatient Age: 58 Room: [facility] ENDO ROOM 3 Gender: Male Note Status: Finalized Instrument Name: Colon Scope [id number] Procedure:             Colonoscopy Indications:           Screening for colorectal malignant neoplasm due to                         positive Cologuard test Providers:             Ruel Kung MD, MD Medicines:             Monitored Anesthesia Care Complications:         No immediate complications. Procedure:             Pre-Anesthesia Assessment:                        - Prior to the procedure, a History and Physical was                         performed, and patient medications, allergies and                         sensitivities were reviewed. The patient's tolerance                         of previous anesthesia was reviewed.                        - The risks and benefits of the procedure and the                         sedation options and risks were discussed with the                         patient. All questions were answered and informed                         consent was obtained.                        - ASA Grade Assessment: II - A patient with mild                         systemic disease.                        After obtaining informed consent, the colonoscope was                         passed under direct vision. Throughout the procedure,                         the patient's blood pressure, pulse, and oxygen                         saturations were monitored continuously. The  Colonoscope was introduced through the anus and                         advanced to the the cecum, identified by the                         appendiceal orifice. The colonoscopy was performed                         with ease. The patient tolerated the procedure well.                          The quality of the bowel preparation was excellent.                         The ileocecal valve, appendiceal orifice, and rectum                         were photographed. Findings:      The perianal and digital rectal examinations were normal.      A 5 mm polyp was found in the sigmoid colon. The polyp was sessile. The       polyp was removed with a cold snare. Resection and retrieval were       complete.      Two sessile polyps were found in the ascending colon. The polyps were 5       to 6 mm in size. These polyps were removed with a cold snare. Resection       and retrieval were complete.      Non-bleeding internal hemorrhoids were found during retroflexion. The       hemorrhoids were medium-sized and Grade I (internal hemorrhoids that do       not prolapse).      The exam was otherwise without abnormality on direct and retroflexion       views. Impression:            - One 5 mm polyp in the sigmoid colon, removed with a                         cold snare. Resected and retrieved.                        - Two 5 to 6 mm polyps in the ascending colon, removed                         with a cold snare. Resected and retrieved.                        - Non-bleeding internal hemorrhoids.                        - The examination was otherwise normal on direct and                         retroflexion views. Recommendation:        - Discharge patient to home (with escort).                        - Resume  previous diet.                        - Continue present medications.                        - Await pathology results.                        - Repeat colonoscopy in 3 - 5 years for surveillance                         based on pathology results. Procedure Code(s):     --- Professional ---                        [phone], Colonoscopy, flexible; with removal of                         tumor(s), polyp(s), or other lesion(s) by snare                          technique Diagnosis Code(s):     --- Professional ---                        Z12.11, Encounter for screening for malignant neoplasm                         of colon                        R19.5, Other fecal abnormalities                        D12.5, Benign neoplasm of sigmoid colon                        D12.2, Benign neoplasm of ascending colon                        K64.0, First degree hemorrhoids CPT copyright 2022 [employer]. All rights reserved. The codes documented in this report are preliminary and upon coder review may  be revised to meet current compliance requirements. Ruel Kung, MD Ruel Kung MD, MD 09/03/2024 9:57:14 AM This report has been signed electronically. Number of Addenda: 0 Note Initiated On: 09/03/2024 9:11 AM Scope Withdrawal Time: 0 hours 8 minutes 30 seconds  Total Procedure Duration: 0 hours 9 minutes 42 seconds  Estimated Blood Loss:  Estimated blood loss: none.      [facility]
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
## 2024-09-03 NOTE — Anesthesia Preprocedure Evaluation (Signed)
 Anesthesia Evaluation  Patient identified by MRN, date of birth, ID band Patient awake    Reviewed: Allergy & Precautions, NPO status , Patient's Chart, lab work & pertinent test results  History of Anesthesia Complications Negative for: history of anesthetic complications  Airway Mallampati: III  TM Distance: >3 FB Neck ROM: Full    Dental  (+) Dental Advidsory Given, Missing, Teeth Intact   Pulmonary neg shortness of breath, neg sleep apnea, neg COPD, neg recent URI, Patient abstained from smoking., former smoker   breath sounds clear to auscultation- rhonchi (-) wheezing      Cardiovascular Exercise Tolerance: Good (-) hypertension(-) angina (-) CAD, (-) Past MI, (-) Cardiac Stents and (-) CABG (-) dysrhythmias  Rhythm:Regular Rate:Normal - Systolic murmurs and - Diastolic murmurs    Neuro/Psych neg Seizures negative neurological ROS  negative psych ROS   GI/Hepatic negative GI ROS, Neg liver ROS,,,  Endo/Other  negative endocrine ROSneg diabetes    Renal/GU negative Renal ROS     Musculoskeletal negative musculoskeletal ROS (+)    Abdominal  (+) + obese  Peds  Hematology  (+) HIV  Anesthesia Other Findings Past Medical History: No date: Avascular necrosis of bones of both hips (HCC) No date: HIV (human immunodeficiency virus infection) (HCC)   Reproductive/Obstetrics                              Lab Results  Component Value Date   WBC 7.3 06/05/2019   HGB 17.6 06/05/2019   HCT 49.4 06/05/2019   MCV 100 (H) 06/05/2019   PLT 181 06/05/2019    Anesthesia Physical Anesthesia Plan  ASA: 2  Anesthesia Plan: General   Post-op Pain Management:    Induction: Intravenous  PONV Risk Score and Plan: 2 and Propofol  infusion, TIVA and Treatment may vary due to age or medical condition  Airway Management Planned: Natural Airway and Nasal Cannula  Additional Equipment:   Intra-op  Plan:   Post-operative Plan:   Informed Consent: I have reviewed the patients History and Physical, chart, labs and discussed the procedure including the risks, benefits and alternatives for the proposed anesthesia with the patient or authorized representative who has indicated his/her understanding and acceptance.     Dental advisory given  Plan Discussed with: CRNA and Anesthesiologist  Anesthesia Plan Comments:          Anesthesia Quick Evaluation

## 2024-09-04 LAB — SURGICAL PATHOLOGY

## 2024-09-04 NOTE — Anesthesia Postprocedure Evaluation (Signed)
 Anesthesia Post Note  Patient: Roy Gonzalez  Procedure(s) Performed: COLONOSCOPY POLYPECTOMY, INTESTINE  Patient location during evaluation: Endoscopy Anesthesia Type: General Level of consciousness: awake and alert Pain management: pain level controlled Vital Signs Assessment: post-procedure vital signs reviewed and stable Respiratory status: spontaneous breathing, nonlabored ventilation, respiratory function stable and patient connected to nasal cannula oxygen Cardiovascular status: blood pressure returned to baseline and stable Postop Assessment: no apparent nausea or vomiting Anesthetic complications: no   No notable events documented.   Last Vitals:  Vitals:   09/03/24 1025 09/03/24 1039  BP: 108/71 109/71  Pulse:    Resp:    Temp:    SpO2:      Last Pain:  Vitals:   09/03/24 1039  TempSrc:   PainSc: 0-No pain                 Prentice Murphy
